# Patient Record
Sex: Female | Born: 1985 | Race: Black or African American | Hispanic: No | Marital: Single | Smoking: Never smoker
Health system: Southern US, Community
[De-identification: ages and names within clinical notes are randomized; demographics above are authoritative.]

## PROBLEM LIST (undated history)

## (undated) ENCOUNTER — Inpatient Hospital Stay (HOSPITAL_COMMUNITY): Payer: Self-pay

## (undated) DIAGNOSIS — D219 Benign neoplasm of connective and other soft tissue, unspecified: Secondary | ICD-10-CM

## (undated) DIAGNOSIS — R87629 Unspecified abnormal cytological findings in specimens from vagina: Secondary | ICD-10-CM

## (undated) HISTORY — PX: WISDOM TOOTH EXTRACTION: SHX21

## (undated) HISTORY — DX: Benign neoplasm of connective and other soft tissue, unspecified: D21.9

## (undated) HISTORY — PX: COLPOSCOPY: SHX161

## (undated) HISTORY — PX: THERAPEUTIC ABORTION: SHX798

---

## 2003-12-05 ENCOUNTER — Emergency Department (HOSPITAL_COMMUNITY): Admission: EM | Admit: 2003-12-05 | Discharge: 2003-12-05 | Payer: Self-pay | Admitting: Emergency Medicine

## 2003-12-05 ENCOUNTER — Inpatient Hospital Stay (HOSPITAL_COMMUNITY): Admission: AD | Admit: 2003-12-05 | Discharge: 2003-12-05 | Payer: Self-pay | Admitting: *Deleted

## 2003-12-05 ENCOUNTER — Encounter (INDEPENDENT_AMBULATORY_CARE_PROVIDER_SITE_OTHER): Payer: Self-pay | Admitting: Specialist

## 2004-10-07 ENCOUNTER — Inpatient Hospital Stay (HOSPITAL_COMMUNITY): Admission: AD | Admit: 2004-10-07 | Discharge: 2004-10-07 | Payer: Self-pay | Admitting: Family Medicine

## 2005-05-18 ENCOUNTER — Emergency Department (HOSPITAL_COMMUNITY): Admission: AD | Admit: 2005-05-18 | Discharge: 2005-05-18 | Payer: Self-pay | Admitting: Emergency Medicine

## 2005-09-27 ENCOUNTER — Emergency Department (HOSPITAL_COMMUNITY): Admission: EM | Admit: 2005-09-27 | Discharge: 2005-09-28 | Payer: Self-pay | Admitting: Emergency Medicine

## 2006-06-03 ENCOUNTER — Inpatient Hospital Stay (HOSPITAL_COMMUNITY): Admission: AD | Admit: 2006-06-03 | Discharge: 2006-06-03 | Payer: Self-pay | Admitting: Gynecology

## 2006-11-16 ENCOUNTER — Inpatient Hospital Stay (HOSPITAL_COMMUNITY): Admission: AD | Admit: 2006-11-16 | Discharge: 2006-11-16 | Payer: Self-pay | Admitting: Obstetrics and Gynecology

## 2006-11-16 ENCOUNTER — Inpatient Hospital Stay (HOSPITAL_COMMUNITY): Admission: AD | Admit: 2006-11-16 | Discharge: 2006-11-19 | Payer: Self-pay | Admitting: Obstetrics and Gynecology

## 2006-12-24 ENCOUNTER — Emergency Department (HOSPITAL_COMMUNITY): Admission: EM | Admit: 2006-12-24 | Discharge: 2006-12-24 | Payer: Self-pay | Admitting: Emergency Medicine

## 2007-06-22 ENCOUNTER — Emergency Department (HOSPITAL_COMMUNITY): Admission: EM | Admit: 2007-06-22 | Discharge: 2007-06-22 | Payer: Self-pay | Admitting: Family Medicine

## 2009-07-26 ENCOUNTER — Emergency Department (HOSPITAL_COMMUNITY): Admission: EM | Admit: 2009-07-26 | Discharge: 2009-07-26 | Payer: Self-pay | Admitting: Emergency Medicine

## 2010-07-10 ENCOUNTER — Emergency Department (HOSPITAL_COMMUNITY)
Admission: EM | Admit: 2010-07-10 | Discharge: 2010-07-11 | Payer: Self-pay | Source: Home / Self Care | Admitting: Emergency Medicine

## 2010-07-11 LAB — WET PREP, GENITAL
Trich, Wet Prep: NONE SEEN
WBC, Wet Prep HPF POC: NONE SEEN
Yeast Wet Prep HPF POC: NONE SEEN

## 2010-07-11 LAB — URINALYSIS, ROUTINE W REFLEX MICROSCOPIC
Bilirubin Urine: NEGATIVE
Ketones, ur: NEGATIVE mg/dL
Leukocytes, UA: NEGATIVE
Nitrite: NEGATIVE
Protein, ur: NEGATIVE mg/dL
Specific Gravity, Urine: 1.03 (ref 1.005–1.030)
Urine Glucose, Fasting: NEGATIVE mg/dL
Urobilinogen, UA: 1 mg/dL (ref 0.0–1.0)
pH: 6 (ref 5.0–8.0)

## 2010-07-11 LAB — URINE MICROSCOPIC-ADD ON

## 2010-07-11 LAB — PREGNANCY, URINE: Preg Test, Ur: NEGATIVE

## 2010-07-12 LAB — GC/CHLAMYDIA PROBE AMP, GENITAL
Chlamydia, DNA Probe: NEGATIVE
GC Probe Amp, Genital: NEGATIVE

## 2010-07-19 ENCOUNTER — Inpatient Hospital Stay (INDEPENDENT_AMBULATORY_CARE_PROVIDER_SITE_OTHER)
Admission: RE | Admit: 2010-07-19 | Discharge: 2010-07-19 | Disposition: A | Discharge status: Home / Self Care | Payer: 59 | Source: Ambulatory Visit | Attending: Family Medicine | Admitting: Family Medicine

## 2010-07-19 DIAGNOSIS — L259 Unspecified contact dermatitis, unspecified cause: Secondary | ICD-10-CM

## 2010-10-29 NOTE — Discharge Summary (Signed)
NAME:  Chelsey Cook, Chelsey Cook             ACCOUNT NO.:  [id number]   MEDICAL RECORD NO.:  [id number]          PATIENT TYPE:  INP   LOCATION:  9119                          FACILITY:  [facility]   PHYSICIAN:  Osborn Coho, M.D.   DATE OF BIRTH:  January 03, 1986   DATE OF ADMISSION:  11/16/2006  DATE OF DISCHARGE:  11/19/2006                               DISCHARGE SUMMARY   ADMISSION DIAGNOSES:  1. Intrauterine pregnancy at term.  2. Early labor.  3. Spontaneous rupture of membranes.   DISCHARGE DIAGNOSES:  1. Intrauterine pregnancy at term.  2. Meconium-stained fluid.  3. Fetal tachycardia.  4. Maternal fever.   PROCEDURES:  1. Vacuum-assisted vaginal delivery.  2. Epidural anesthesia.   HOSPITAL COURSE:  [name] is a 25 year old gravida 2, para 0-0-1-0 at  39-1/7 weeks who presented in early labor on the afternoon of November 16, 2006.  The pregnancy has been remarkable for:  1. First trimester BV.  2. Group B strep negative.   On admission, the patient was 3 cm, 70%, vertex at minus 1 station.  She  had spontaneous rupture of membranes on exam with thin meconium and  blood-tinged fluid noted.  An epidural was placed for comfort.  Temperature at that time was 99.1.  Cervix was 6 to 7 cm.  Pitocin was  begun secondary to a plateauing of labor at that stage.  The baseline at  that time was 150s with early decelerations.  IUPC was placed.  The  patient then progressed to fully dilated at 1:55 a.m. and was complete  for 30-40 minutes and pushing with good effort.  Fetal vertex was at a  +4 station.  The fetal heart rate baseline began to increase to a  maximum of 190.  The patient had a temperature of 101.4 at that point  and had been given Tylenol.  She continued with pushing effort; however,  the decision was made in light of maternal fever and fetal tachycardia  to proceed with vacuum-assisted vaginal delivery by Dr. Pennie Rushing.  This  was accomplished without difficulty.  Findings were a  viable female by  the name of Chelsey Cook, weight 6 pounds 12 ounces, Apgars were 8 and 9.  Infant was taken to the full-term nursery.  Mother was taken to recovery  in good condition.  By postpartum day 1, the patient was doing well.  She had a temperature max of 100.9 at 4 a.m. on November 17, 2006.  She had  been afebrile since that time.  Hemoglobin was 9.3 on day 1.  Hematocrit  was 27.2, white blood cell count was 18.5 and platelet count was 208.  She was having no tenderness in her abdomen.  Her fundus was firm.  Her  lochia was moderate.  Baby was bottle feeding.  The rest of the hospital  stay, the patient did well.  She had no complications.  She was using  pain medication with good relief.  She had had no lacerations.  By  postpartum day #2, she had a second-degree laceration and did not  require suture.  By postpartum day #2, the  patient was doing well.  She  was up ad lib.  She was tolerating a regular diet.  She was deemed to  have received full benefit of her hospital stay and was discharged home.   DISCHARGE INSTRUCTIONS:  Per [facility] handout.   DISCHARGE MEDICATIONS:  1. Motrin 600 mg p.o. q.6h. p.r.n. pain.  2. Percocet 5/325 one to two p.o. every 3 to 4 hours p.r.n. pain.   Discharge follow-up will occur in 6 weeks at [facility].      Chelsey Cook, C.N.M.      Osborn Coho, M.D.  Electronically Signed    VLL/MEDQ  D:  11/19/2006  T:  11/19/2006  Job:  [id number]

## 2010-10-29 NOTE — Op Note (Signed)
NAME:  [place], Tirzah             ACCOUNT NO.:  [id number]   MEDICAL RECORD NO.:  [id number]          PATIENT TYPE:  INP   LOCATION:  9119                          [facility]ACILITY:  [facility]   PHYSICIAN:  Hal Morales, M.D.DATE OF BIRTH:  January 30, 1986   DATE OF PROCEDURE:  11/17/2006  DATE OF DISCHARGE:                               OPERATIVE REPORT   PREOPERATIVE DIAGNOSES:  1. Intrauterine pregnancy at term.  2. Meconium-stained amniotic fluid.  3. Maternal fever.  4. Fetal tachycardia.   POSTOPERATIVE DIAGNOSES:  1. Intrauterine pregnancy at term.  2. Meconium-stained amniotic fluid.  3. Maternal fever.  4. Fetal tachycardia.   OPERATION:  Vacuum-assisted vaginal delivery.   ANESTHESIA:  Epidural.   ESTIMATED BLOOD LOSS:  Less than 500 mL.   COMPLICATIONS:  None.   FINDINGS:  The patient was delivered of a female infant whose name is  Jurnee, weighing 6 pounds 12 ounces with Apgars of 8 and 9 at one and  five minutes, respectively.  The patient had a second-degree vaginal  laceration at the introitus which was not bleeding and did not require  suture.   PREOPERATIVE ASSESSMENT:  The patient had been complete for  approximately 30-40 minutes and had been pushing with good effort.  The  fetal vertex was at +4 station with approximately 4 cm of dilatation of  the perineum with pushing.  The fetal heart rate which had originally  been in the 150s began to increase the baseline to a maximum of 190s.  The patient had been noted to have a temperature of 101.4 and had been  given Tylenol.  She continued pushing but without much progress beyond  the above-noted point.  At the time that fetal heart reached 190, a  discussion was held concerning shortening the second stage, and the  risks and benefits of vacuum-assisted vaginal delivery were reviewed.  The patient consented to vacuum-assisted delivery.   PROCEDURE:  The bladder had been emptied with a continuous Foley  catheter  until the patient had started pushing.  It had been recently  removed.  The perineum had already been prepped and draped.  A kiwi  vacuum extractor was then placed into the vagina over the fetal vertex,  and with a single contraction, the vertex delivered over the intact  perineum with the aid of the kiwi vacuum.  The vacuum was removed, and  the remainder of the infant was delivered with a combination of gentle  traction and maternal expulsive efforts.  The infant was delivered from  the occiput anterior position.  The nares and pharynx were suctioned and  the cord clamped and cut.  The infant was handed off to the awaiting  assistants in the infant warmer.  The appropriate cord blood was drawn,  and placenta  spontaneously separated from the uterus and was removed from the vagina  with a combination of gentle traction and maternal expulsive efforts.  The perineum was cleansed and inspected.  Ice packs were placed on the  perineum for patient comfort.  The infant went to the full-term nursery.      Erie Noe P  Pennie Rushing, M.D.  Electronically Signed     VPH/MEDQ  D:  11/17/2006  T:  11/17/2006  Job:  [id number]

## 2010-10-29 NOTE — H&P (Signed)
NAME:  Chelsey Cook, Chelsey Cook             ACCOUNT NO.:  [id number]   MEDICAL RECORD NO.:  [id number]          PATIENT TYPE:  INP   LOCATION:  9163                          [facility]ACILITY:  [facility]   PHYSICIAN:  Hal Morales, M.D.DATE OF BIRTH:  [birth year]   DATE OF ADMISSION:  11/16/2006  DATE OF DISCHARGE:                              HISTORY & PHYSICAL   The patient is a 25 year old gravida 2, para 0-0-1-0 who presents at 73-  1/7 weeks, EDD November 22, 2006.  She presents with contractions since 3:00  a.m. becoming stronger and more regular throughout the day. She was  checked in maternity admissions at 5:00 a.m. and found to be 1 cm  dilated.  She was checked later in a day at the office of [facility] and found  to be 2 cm dilated.  She is now noted to be 3 cm dilated with  spontaneous rupture of membranes on exam for thin meconium blood tinged  fluid.  She reports positive fetal movement.  Denies any headache,  visual changes or epigastric pain.  Her pregnancy has been followed by  the MD service at [facility] and is remarkable for:  1. First trimester BV.  2. Group B strep negative.   This patient entered prenatal care at the office of [facility] on May 20, 2006 at approximately 12 weeks' gestation, [facility] determined by the use of  the first trimester ultrasound and confirmed with follow-up.  Her  pregnancy has been essentially unremarkable.  She has been size equal to  dates throughout, normotensive with no proteinuria.  The patient is  noted to have positive chlamydia in the third trimester.  Then was  treated on Oct 26, 2006 with Zithromax 1 gram p.o.  Prenatal lab work on  May 20, 2006 hemoglobin and hematocrit 12.9 at 36.3, platelets  297,000.  Blood type and Rh O+, antibody screen negative, sickle cell  trait negative, VDRL nonreactive, rubella immune, hepatitis B surface  antigen negative, HIV nonreactive, CF testing negative.  Quad screen  within normal limits.  36 weeks culture of the vaginal  tract is negative  for group B strep, positive for chlamydia.  She was treated as stated  above with Zithromax 1 gram on Oct 26, 2006.   OB HISTORY:  In 2005 the patient had a first trimester SAB. This is her  second and current pregnancy.   SURGICAL HISTORY:  Oral surgery for wisdom teeth at age 25.  The patient  denies the use of tobacco, alcohol or illicit drugs.   FAMILY HISTORY:  Maternal grandmother with a history of chronic  hypertension. The patient's mother with a history of thyroid disease.  Brother with migraines.  Paternal grandmother and paternal grandfather  deceased with cancer of unknown type.   GENETIC HISTORY:  The patient has a cousin with cerebral palsy.   SOCIAL HISTORY:  [name] is a single African American female.  She is  a Consulting civil engineer at [employer]. The father of the baby Casimiro Needle, works in [employer]ice [employer].  He is supportive.  They are [facility] in their faith.  The  patient has  no known drug allergies.   REVIEW OF SYMPTOMS:  Review of systems is as described above.  The  patient is 25 years old gravida 2, para 0-0-1-0 who presents at 39-1/7  weeks in early labor.  She is noted to be 3 cm dilated with spontaneous  rupture of membranes on exam for thin meconium blood tinged fluid.   PHYSICAL EXAM:  VITAL SIGNS: Stable, afebrile.  HEENT: Is unremarkable.  HEART:  Regular rate and rhythm.  LUNGS:  Clear.  ABDOMEN:  Soft and nontender.  It is gravid in its contour.  Uterine  fundus is noted to extend 39 cm above the level of the pubic symphysis.  Leopold's maneuvers finds the infant to be a longitudinal lie, cephalic  presentation and the estimated fetal weight is 7-1/2 pounds.  Baseline  of fetal heart rate monitor is 128 with average long-term variability,  reactivity is present with no periodic changes.  The patient is  contracting every 5 minutes.  PELVIC:  Digital exam of the cervix finds it to be 3 cm dilated, 70%  effaced with a cephalic presenting part at  -1 station.  Spontaneous  rupture of membranes occurred on exam for meconium-stained blood tinged  fluid.  EXTREMITIES:  Show no pathologic edema.  DTRs are 1+ with no clonus,  negative Denna Haggard' sign is noted bilaterally.   ASSESSMENT:  Intrauterine pregnancy at term, early labor.  Spontaneous  rupture of membranes.   PLAN:  Admit per Dr. Dierdre Forth, routine MD orders.  The patient  may have epidural p.r.n. for pain relief.      Rica Koyanagi, C.N.M.      Hal Morales, M.D.  Electronically Signed    SDM/MEDQ  D:  11/16/2006  T:  11/16/2006  Job:  [id number]

## 2011-01-17 ENCOUNTER — Inpatient Hospital Stay (INDEPENDENT_AMBULATORY_CARE_PROVIDER_SITE_OTHER)
Admission: RE | Admit: 2011-01-17 | Discharge: 2011-01-17 | Disposition: A | Discharge status: Home / Self Care | Payer: 59 | Source: Ambulatory Visit | Attending: Family Medicine | Admitting: Family Medicine

## 2011-01-17 DIAGNOSIS — K5289 Other specified noninfective gastroenteritis and colitis: Secondary | ICD-10-CM

## 2011-03-05 LAB — URINE CULTURE: Colony Count: 25000

## 2011-03-05 LAB — POCT URINALYSIS DIP (DEVICE)
Glucose, UA: NEGATIVE
Ketones, ur: 15 — AB
Nitrite: NEGATIVE
Operator id: 235561
Protein, ur: 30 — AB
Specific Gravity, Urine: 1.025
Urobilinogen, UA: 2 — ABNORMAL HIGH
pH: 6

## 2011-03-05 LAB — POCT PREGNANCY, URINE
Operator id: 235561
Preg Test, Ur: NEGATIVE

## 2011-04-03 LAB — CBC
HCT: 27.2 — ABNORMAL LOW
HCT: 35.8 — ABNORMAL LOW
Hemoglobin: 12.1
Hemoglobin: 9.3 — ABNORMAL LOW
MCHC: 33.8
MCHC: 34.3
MCV: 90.4
MCV: 91.4
Platelets: 208
Platelets: 245
RBC: 2.98 — ABNORMAL LOW
RBC: 3.96
RDW: 14
RDW: 14.3 — ABNORMAL HIGH
WBC: 11 — ABNORMAL HIGH
WBC: 18.5 — ABNORMAL HIGH

## 2011-04-03 LAB — GC/CHLAMYDIA PROBE AMP, GENITAL
Chlamydia, DNA Probe: NEGATIVE
GC Probe Amp, Genital: NEGATIVE

## 2011-04-03 LAB — RPR TITER: RPR Titer: 1:2 {titer} — AB

## 2011-04-03 LAB — RPR: RPR Ser Ql: REACTIVE — AB

## 2011-04-11 LAB — TPPA: Treponema Confirm: NONREACTIVE

## 2011-06-24 ENCOUNTER — Emergency Department (INDEPENDENT_AMBULATORY_CARE_PROVIDER_SITE_OTHER)
Admission: EM | Admit: 2011-06-24 | Discharge: 2011-06-24 | Disposition: A | Discharge status: Home / Self Care | Payer: BC Managed Care – PPO | Source: Home / Self Care | Attending: Emergency Medicine | Admitting: Emergency Medicine

## 2011-06-24 ENCOUNTER — Encounter: Payer: Self-pay | Admitting: *Deleted

## 2011-06-24 DIAGNOSIS — J029 Acute pharyngitis, unspecified: Secondary | ICD-10-CM

## 2011-06-24 LAB — POCT RAPID STREP A: Streptococcus, Group A Screen (Direct): NEGATIVE

## 2011-06-24 MED ORDER — DICLOFENAC SODIUM 75 MG PO TBEC: 75.0000 mg | DELAYED_RELEASE_TABLET | Freq: Two times a day (BID) | ORAL | Status: AC

## 2011-06-24 NOTE — ED Notes (Signed)
Pt  Reports  Symptoms  Of  sorethroat        And  painfull  Swallowing  For  sev  Days  Symptoms  Not  releived  By otc  meds   She  Is  Sitting  Upright  On  Exam table  And  Appears  In no  Distress        Speaking in  Complete  sentances

## 2011-06-24 NOTE — ED Provider Notes (Signed)
Chief Complaint  Patient presents with  . Sore Throat    History of Present Illness:  Chelsey Cook is a 26 year old female who has had a two-day history of sore throat, pain on swallowing, and nasal congestion. She has gotten the influenza vaccine. She denies any rhinorrhea or sneezing. No adenopathy, headache, coughing, shortness of breath, abdominal pain, nausea, or vomiting. She has not been exposed to strep.  Review of Systems:  Other than noted above, the patient denies any of the following symptoms. Systemic:  No fever, chills, sweats, fatigue, myalgias, headache, or anorexia. Eye:  No redness, pain or drainage. ENT:  No earache, nasal congestion, rhinorrhea, sinus pressure, or sore throat. Lungs:  No cough, sputum production, wheezing, shortness of breath. Or chest pain. GI:  No nausea, vomiting, abdominal pain or diarrhea. Skin:  No rash or itching.  PMFSH:  Past medical history, family history, social history, meds, and allergies were reviewed.  Physical Exam:   Vital signs:  BP 119/77  Pulse 73  Temp(Src) 98.8 F (37.1 C) (Oral)  Resp 16  SpO2 100% General:  Alert, in no distress. Eye:  No conjunctival injection or drainage. ENT:  TMs and canals were normal, without erythema or inflammation.  Nasal mucosa was clear and uncongested, without drainage.  Mucous membranes were moist.  Pharynx was erythematous, without exudate or drainage.  There were no oral ulcerations or lesions. Neck:  Supple, no adenopathy, tenderness or mass. Lungs:  No respiratory distress.  Lungs were clear to auscultation, without wheezes, rales or rhonchi.  Breath sounds were clear and equal bilaterally. Heart:  Regular rhythm, without gallops, murmers or rubs. Skin:  Clear, warm, and dry, without rash or lesions.  Labs:   Results for orders placed during the hospital encounter of 06/24/11  POCT RAPID STREP A ([facility] ONLY)      Component Value Range   Streptococcus, Group A Screen (Direct) NEGATIVE   NEGATIVE      Radiology:  No results found.  Medications given in UCC:  None  Assessment:   Diagnoses that have been ruled out:  Diagnoses that are still under consideration:  Final diagnoses:  Viral pharyngitis     Plan:   1.  The following meds were prescribed:   New Prescriptions   DICLOFENAC (VOLTAREN) 75 MG EC TABLET    Take 1 tablet (75 mg total) by mouth 2 (two) times daily.   2.  The patient was instructed in symptomatic care and handouts were given. 3.  The patient was told to return if becoming worse in any way, if no better in 3 or 4 days, and given some red flag symptoms that would indicate earlier return.   Roque Lias, MD 06/24/11 [phone]

## 2012-01-07 ENCOUNTER — Encounter: Payer: Self-pay | Admitting: Cardiology

## 2012-11-04 ENCOUNTER — Encounter (HOSPITAL_COMMUNITY): Payer: Self-pay | Admitting: Emergency Medicine

## 2012-11-04 ENCOUNTER — Emergency Department (HOSPITAL_COMMUNITY)
Admission: EM | Admit: 2012-11-04 | Discharge: 2012-11-04 | Disposition: A | Discharge status: Home / Self Care | Payer: BC Managed Care – PPO | Attending: Emergency Medicine | Admitting: Emergency Medicine

## 2012-11-04 DIAGNOSIS — R197 Diarrhea, unspecified: Secondary | ICD-10-CM | POA: Insufficient documentation

## 2012-11-04 DIAGNOSIS — E860 Dehydration: Secondary | ICD-10-CM | POA: Insufficient documentation

## 2012-11-04 DIAGNOSIS — R000 Tachycardia, unspecified: Secondary | ICD-10-CM | POA: Insufficient documentation

## 2012-11-04 DIAGNOSIS — Z8711 Personal history of peptic ulcer disease: Secondary | ICD-10-CM | POA: Insufficient documentation

## 2012-11-04 DIAGNOSIS — R109 Unspecified abdominal pain: Secondary | ICD-10-CM | POA: Insufficient documentation

## 2012-11-04 LAB — URINALYSIS, ROUTINE W REFLEX MICROSCOPIC
Bilirubin Urine: NEGATIVE
Glucose, UA: NEGATIVE mg/dL
Ketones, ur: 15 mg/dL — AB
Nitrite: NEGATIVE
Protein, ur: NEGATIVE mg/dL
Specific Gravity, Urine: 1.025 (ref 1.005–1.030)
Urobilinogen, UA: 0.2 mg/dL (ref 0.0–1.0)
pH: 6 (ref 5.0–8.0)

## 2012-11-04 LAB — CBC WITH DIFFERENTIAL/PLATELET
Basophils Absolute: 0 10*3/uL (ref 0.0–0.1)
Basophils Relative: 0 % (ref 0–1)
Eosinophils Absolute: 0.3 10*3/uL (ref 0.0–0.7)
Eosinophils Relative: 5 % (ref 0–5)
HCT: 39.2 % (ref 36.0–46.0)
Hemoglobin: 14.1 g/dL (ref 12.0–15.0)
Lymphocytes Relative: 26 % (ref 12–46)
Lymphs Abs: 1.5 10*3/uL (ref 0.7–4.0)
MCH: 31.5 pg (ref 26.0–34.0)
MCHC: 36 g/dL (ref 30.0–36.0)
MCV: 87.7 fL (ref 78.0–100.0)
Monocytes Absolute: 0.7 10*3/uL (ref 0.1–1.0)
Monocytes Relative: 11 % (ref 3–12)
Neutro Abs: 3.4 10*3/uL (ref 1.7–7.7)
Neutrophils Relative %: 58 % (ref 43–77)
Platelets: 325 10*3/uL (ref 150–400)
RBC: 4.47 MIL/uL (ref 3.87–5.11)
RDW: 12.5 % (ref 11.5–15.5)
WBC: 5.9 10*3/uL (ref 4.0–10.5)

## 2012-11-04 LAB — BASIC METABOLIC PANEL
BUN: 8 mg/dL (ref 6–23)
CO2: 19 mEq/L (ref 19–32)
Calcium: 9 mg/dL (ref 8.4–10.5)
Chloride: 104 mEq/L (ref 96–112)
Creatinine, Ser: 0.64 mg/dL (ref 0.50–1.10)
GFR calc Af Amer: >90 mL/min (ref >90)
GFR calc non Af Amer: >90 mL/min (ref >90)
Glucose, Bld: 93 mg/dL (ref 70–99)
Potassium: 3.2 mEq/L — ABNORMAL LOW (ref 3.5–5.1)
Sodium: 138 mEq/L (ref 135–145)

## 2012-11-04 LAB — URINE MICROSCOPIC-ADD ON

## 2012-11-04 LAB — PREGNANCY, URINE: Preg Test, Ur: NEGATIVE

## 2012-11-04 MED ORDER — ONDANSETRON 4 MG PO TBDP: 4.0000 mg | ORAL_TABLET | Freq: Once | ORAL | Status: DC

## 2012-11-04 MED ORDER — SODIUM CHLORIDE 0.9 % IV BOLUS (SEPSIS)
1000.0000 mL | Freq: Once | INTRAVENOUS | Status: AC
  Administered 2012-11-04: 1000 mL via INTRAVENOUS

## 2012-11-04 MED ORDER — POTASSIUM CHLORIDE CRYS ER 20 MEQ PO TBCR
40.0000 meq | EXTENDED_RELEASE_TABLET | Freq: Once | ORAL | Status: AC
  Administered 2012-11-04: 40 meq via ORAL
  Filled 2012-11-04: qty 2

## 2012-11-04 NOTE — ED Notes (Signed)
Patient states currently not nausea and does not want zofran. PA notified.

## 2012-11-04 NOTE — ED Notes (Signed)
Onset 2 days ago general abdominal pain achy with diarrhea. Denies nausea and emesis.

## 2012-11-04 NOTE — ED Provider Notes (Signed)
Medical screening examination/treatment/procedure(s) were performed by non-physician practitioner and as supervising physician I was immediately available for consultation/collaboration.   Rolan Bucco, MD 11/04/12 1517

## 2012-11-04 NOTE — ED Provider Notes (Signed)
History     CSN: [id number]  Arrival date & time 11/04/12  4696   First MD Initiated Contact with Patient 11/04/12 1008      Chief Complaint  Patient presents with  . Abdominal Pain  . Diarrhea    (Consider location/radiation/quality/duration/timing/severity/associated sxs/prior treatment) HPI  Patient presents to the ER by private care for painless diarrhea since Tuesday. She has been unable to keep any food or liquid down. She has tried eating hot dogs, cheese and cheese cake with diarrhea. For liquid she has been attempting to drink milk. She took Lomotil one time but drank milk right afterwards and had an episode of diarrhea. She denies any cramping or abdominal pains. No vomiting or nausea, no sick contacts. Denies eating fast food or left overs. No bloating or hx of recent seizures, no vaginal dc or dysuria. Came in because of concerns of being dehydrated.  Patient is tachycardic at 102. Rest of vital signs are stable, healthy at baseline.   History reviewed. No pertinent past medical history.  History reviewed. No pertinent past surgical history.  No family history on file.  History  Substance Use Topics  . Smoking status: Never Smoker   . Smokeless tobacco: Not on file  . Alcohol Use: Yes    OB History   Grav Para Term Preterm Abortions TAB SAB Ect Mult Living                  Review of Systems  Allergies  Review of patient's allergies indicates no known allergies.  Home Medications   Current Outpatient Rx  Name  Route  Sig  Dispense  Refill  . acetaminophen (TYLENOL) 325 MG tablet   Oral   Take 650 mg by mouth daily as needed for pain.         Marland Kitchen bismuth subsalicylate (PEPTO BISMOL) 262 MG chewable tablet   Oral   Chew 524 mg by mouth as needed for indigestion.         . Loperamide HCl (IMODIUM PO)   Oral   Take 2 tablets by mouth daily as needed (upset stomach).           BP 126/84  Pulse 97  Temp(Src) 98.4 F (36.9 C) (Oral)  Resp 16   Ht 5\' 3"  (1.6 m)  Wt 133 lb (60.328 kg)  BMI 23.57 kg/m2  SpO2 100%  Physical Exam  Nursing note and vitals reviewed. Constitutional: She appears well-developed and well-nourished. No distress.  HENT:  Head: Normocephalic and atraumatic.  Eyes: Pupils are equal, round, and reactive to light.  Neck: Normal range of motion. Neck supple.  Cardiovascular: Normal rate and regular rhythm.   Pulmonary/Chest: Effort normal.  Abdominal: Soft.  Neurological: She is alert.  Skin: Skin is warm and dry.    ED Course  Procedures (including critical care time)  Labs Reviewed  BASIC METABOLIC PANEL - Abnormal; Notable for the following:    Potassium 3.2 (*)    All other components within normal limits  URINALYSIS, ROUTINE W REFLEX MICROSCOPIC - Abnormal; Notable for the following:    APPearance HAZY (*)    Hgb urine dipstick SMALL (*)    Ketones, ur 15 (*)    Leukocytes, UA SMALL (*)    All other components within normal limits  URINE MICROSCOPIC-ADD ON - Abnormal; Notable for the following:    Squamous Epithelial / LPF MANY (*)    Bacteria, UA MANY (*)    All other components within normal  limits  PREGNANCY, URINE  CBC WITH DIFFERENTIAL   No results found.   1. Diarrhea       MDM  No pain or cramping. Patient given 2L normal saline for rehydration and tachycardia resolved. No lab signs of dehydration. Potassium a little low and placed in ED. Discussed BRAT diet/ hydration for diarrhea. Follow-up with PCP.  Pt has been advised of the symptoms that warrant their return to the ED. Patient has voiced understanding and has agreed to follow-up with the PCP or specialist.         Dorthula Matas, PA-C 11/04/12 1238

## 2013-03-13 ENCOUNTER — Other Ambulatory Visit (HOSPITAL_COMMUNITY)
Admission: RE | Admit: 2013-03-13 | Discharge: 2013-03-13 | Disposition: A | Discharge status: Home / Self Care | Payer: BC Managed Care – PPO | Source: Ambulatory Visit | Attending: Family Medicine | Admitting: Family Medicine

## 2013-03-13 ENCOUNTER — Emergency Department (INDEPENDENT_AMBULATORY_CARE_PROVIDER_SITE_OTHER)
Admission: EM | Admit: 2013-03-13 | Discharge: 2013-03-13 | Disposition: A | Discharge status: Home / Self Care | Payer: BC Managed Care – PPO | Source: Home / Self Care | Attending: Family Medicine | Admitting: Family Medicine

## 2013-03-13 ENCOUNTER — Encounter (HOSPITAL_COMMUNITY): Payer: Self-pay | Admitting: *Deleted

## 2013-03-13 DIAGNOSIS — N939 Abnormal uterine and vaginal bleeding, unspecified: Secondary | ICD-10-CM

## 2013-03-13 DIAGNOSIS — N898 Other specified noninflammatory disorders of vagina: Secondary | ICD-10-CM

## 2013-03-13 DIAGNOSIS — Z113 Encounter for screening for infections with a predominantly sexual mode of transmission: Secondary | ICD-10-CM | POA: Insufficient documentation

## 2013-03-13 DIAGNOSIS — N760 Acute vaginitis: Secondary | ICD-10-CM | POA: Insufficient documentation

## 2013-03-13 LAB — HCG, QUANTITATIVE, PREGNANCY: hCG, Beta Chain, Quant, S: <1 m[IU]/mL (ref <5)

## 2013-03-13 LAB — BASIC METABOLIC PANEL
BUN: 10 mg/dL (ref 6–23)
CO2: 23 mEq/L (ref 19–32)
Calcium: 9.2 mg/dL (ref 8.4–10.5)
Chloride: 103 mEq/L (ref 96–112)
Creatinine, Ser: 0.65 mg/dL (ref 0.50–1.10)
GFR calc Af Amer: >90 mL/min (ref >90)
GFR calc non Af Amer: >90 mL/min (ref >90)
Glucose, Bld: 91 mg/dL (ref 70–99)
Potassium: 3.5 mEq/L (ref 3.5–5.1)
Sodium: 139 mEq/L (ref 135–145)

## 2013-03-13 LAB — CBC WITH DIFFERENTIAL/PLATELET
Basophils Absolute: 0 10*3/uL (ref 0.0–0.1)
Basophils Relative: 0 % (ref 0–1)
Eosinophils Absolute: 0 10*3/uL (ref 0.0–0.7)
Eosinophils Relative: 1 % (ref 0–5)
HCT: 40 % (ref 36.0–46.0)
Hemoglobin: 14.2 g/dL (ref 12.0–15.0)
Lymphocytes Relative: 35 % (ref 12–46)
Lymphs Abs: 1.5 10*3/uL (ref 0.7–4.0)
MCH: 32.2 pg (ref 26.0–34.0)
MCHC: 35.5 g/dL (ref 30.0–36.0)
MCV: 90.7 fL (ref 78.0–100.0)
Monocytes Absolute: 0.3 10*3/uL (ref 0.1–1.0)
Monocytes Relative: 7 % (ref 3–12)
Neutro Abs: 2.4 10*3/uL (ref 1.7–7.7)
Neutrophils Relative %: 57 % (ref 43–77)
Platelets: 269 10*3/uL (ref 150–400)
RBC: 4.41 MIL/uL (ref 3.87–5.11)
RDW: 12.2 % (ref 11.5–15.5)
WBC: 4.2 10*3/uL (ref 4.0–10.5)

## 2013-03-13 LAB — POCT URINALYSIS DIP (DEVICE)
Glucose, UA: NEGATIVE mg/dL
Ketones, ur: NEGATIVE mg/dL
Leukocytes, UA: NEGATIVE
Nitrite: NEGATIVE
Protein, ur: 100 mg/dL — AB
Specific Gravity, Urine: >=1.03 (ref 1.005–1.030)
Urobilinogen, UA: 1 mg/dL (ref 0.0–1.0)
pH: 7 (ref 5.0–8.0)

## 2013-03-13 LAB — POCT PREGNANCY, URINE: Preg Test, Ur: NEGATIVE

## 2013-03-13 NOTE — ED Provider Notes (Signed)
CSN: [id number]     Arrival date & time 03/13/13  1019 History   First MD Initiated Contact with Patient 03/13/13 1132     Chief Complaint  Patient presents with  . Vaginal Bleeding   (Consider location/radiation/quality/duration/timing/severity/associated sxs/prior Treatment) HPI Comments: 27 year old female presents complaining of abnormal vaginal bleeding. Her last normal period started on and it was normal. Yesterday she started bleeding again and is having abdominal cramping. She does not normally have abdominal cramping with her period, but the blood reminds her of a lighter than normal period.  She has never had abnormal bleeding like this before. She denies abdominal pain, just cramping. She denies fever, chills, NVD, chest pain, shortness of breath, rash. She is sexually active and does not use any form of birth control. Denies risk factors for STDs.  Patient is a 27 y.o. female presenting with vaginal bleeding.  Vaginal Bleeding Associated symptoms: abdominal pain (cramping)   Associated symptoms: no dizziness, no dysuria, no fever and no nausea     History reviewed. No pertinent past medical history. History reviewed. No pertinent past surgical history. No family history on file. History  Substance Use Topics  . Smoking status: Never Smoker   . Smokeless tobacco: Not on file  . Alcohol Use: Yes     Comment: occasionally   OB History   Grav Para Term Preterm Abortions TAB SAB Ect Mult Living                 Review of Systems  Constitutional: Negative for fever and chills.  Eyes: Negative for visual disturbance.  Respiratory: Negative for cough and shortness of breath.   Cardiovascular: Negative for chest pain, palpitations and leg swelling.  Gastrointestinal: Positive for abdominal pain (cramping). Negative for nausea and vomiting.  Endocrine: Negative for polydipsia and polyuria.  Genitourinary: Positive for vaginal bleeding. Negative for dysuria, urgency and  frequency.  Musculoskeletal: Negative for myalgias and arthralgias.  Skin: Negative for rash.  Neurological: Negative for dizziness, weakness and light-headedness.    Allergies  Review of patient's allergies indicates no known allergies.  Home Medications   Current Outpatient Rx  Name  Route  Sig  Dispense  Refill  . acetaminophen (TYLENOL) 325 MG tablet   Oral   Take 650 mg by mouth daily as needed for pain.         Marland Kitchen bismuth subsalicylate (PEPTO BISMOL) 262 MG chewable tablet   Oral   Chew 524 mg by mouth as needed for indigestion.         . Loperamide HCl (IMODIUM PO)   Oral   Take 2 tablets by mouth daily as needed (upset stomach).          BP 134/89  Pulse 90  Temp(Src) 98.9 F (37.2 C) (Oral)  Resp 19  SpO2 99%  LMP 02/23/2013 Physical Exam  Nursing note and vitals reviewed. Constitutional: She is oriented to person, place, and time. Vital signs are normal. She appears well-developed and well-nourished. No distress.  HENT:  Head: Normocephalic and atraumatic.  Cardiovascular: Normal rate, regular rhythm and normal heart sounds.   Pulmonary/Chest: Effort normal and breath sounds normal. No respiratory distress.  Abdominal: Soft. She exhibits no mass. There is no tenderness. There is no rebound and no guarding.  Genitourinary: There is bleeding (Large amount of blood in the vaginal vault) around the vagina.  Neurological: She is alert and oriented to person, place, and time. She has normal strength. Coordination normal.  Skin: Skin is  warm and dry. No rash noted. She is not diaphoretic.  Psychiatric: She has a normal mood and affect. Judgment normal.    ED Course  Procedures (including critical care time) Labs Review Labs Reviewed  POCT URINALYSIS DIP (DEVICE) - Abnormal; Notable for the following:    Bilirubin Urine SMALL (*)    Hgb urine dipstick LARGE (*)    Protein, ur 100 (*)    All other components within normal limits  HCG, QUANTITATIVE,  PREGNANCY  CBC WITH DIFFERENTIAL  BASIC METABOLIC PANEL  POCT PREGNANCY, URINE  CERVICOVAGINAL ANCILLARY ONLY   Imaging Review No results found.  MDM   1. Vaginal bleeding    Sending labs for vaginal infections, CBC, BMP. Sending hCG quantitative as well. This may just be normal period and It has only been going on for 2 days. Will use watchful waiting at this time awaiting for labs to come back. Followup with GYN in one week.   Labs all came back normal. Awaiting results of swabs.    Graylon Good, PA-C 03/13/13 2044

## 2013-03-13 NOTE — ED Notes (Signed)
Pt reports last normal menses was 02/23/2013.  She started bleeding again yesterday, less than the normal flow.   She has only used one peri pad today.  She has never had irregular bleeding and wanted to be checked

## 2013-03-15 NOTE — ED Provider Notes (Signed)
Medical screening examination/treatment/procedure(s) were performed by a resident physician or non-physician practitioner and as the supervising physician I was immediately available for consultation/collaboration.  Lylian Sanagustin, MD    Gio Janoski S Bettye Sitton, MD 03/15/13 0742 

## 2013-03-17 ENCOUNTER — Telehealth (HOSPITAL_COMMUNITY): Payer: Self-pay | Admitting: Family Medicine

## 2013-03-17 MED ORDER — METRONIDAZOLE 500 MG PO TABS: 500.0000 mg | ORAL_TABLET | Freq: Two times a day (BID) | ORAL | Status: DC

## 2013-03-17 NOTE — Telephone Encounter (Signed)
Message copied by Rodolph Bong on Thu Mar 17, 2013  7:56 PM ------      Message from: Vassie Moselle      Created: Thu Mar 17, 2013  4:03 PM      Regarding: lab       Gardnerella pos. Rest of labs neg.  Pt. of Zach's- you cosigned.  Do you want to treat this?      Vassie Moselle      03/17/2013       ------

## 2013-03-17 NOTE — ED Notes (Signed)
[place] a positive Called in [place] RN to call patient  Rodolph Bong, MD 03/17/13 [phone]

## 2013-03-18 ENCOUNTER — Telehealth (HOSPITAL_COMMUNITY): Payer: Self-pay | Admitting: *Deleted

## 2013-03-18 NOTE — ED Notes (Addendum)
GC/Chlamydia neg., Affirm: Candida and Trich neg., Gardnerella pos., BHCG <1.  Dr. Denyse Amass e-prescribed Flagyl to [facility] at [facility].  I called pt. and left a message to call. Chelsey Cook 03/18/2013 Pt. called back.  Pt. verified x 2 and given results.  Pt. told she needs Flagyl for bacterial vaginosis.   Pt. instructed to no alcohol while taking this medication.  Pt. told where to pick up Rx. Pt. voiced understanding.

## 2014-05-22 ENCOUNTER — Emergency Department (HOSPITAL_COMMUNITY): Payer: BC Managed Care – PPO

## 2014-05-22 ENCOUNTER — Emergency Department (HOSPITAL_COMMUNITY)
Admission: EM | Admit: 2014-05-22 | Discharge: 2014-05-22 | Disposition: A | Discharge status: Home / Self Care | Payer: Self-pay | Attending: Emergency Medicine | Admitting: Emergency Medicine

## 2014-05-22 ENCOUNTER — Encounter (HOSPITAL_COMMUNITY): Payer: Self-pay | Admitting: Physical Medicine and Rehabilitation

## 2014-05-22 DIAGNOSIS — O4690 Antepartum hemorrhage, unspecified, unspecified trimester: Secondary | ICD-10-CM

## 2014-05-22 DIAGNOSIS — O9989 Other specified diseases and conditions complicating pregnancy, childbirth and the puerperium: Secondary | ICD-10-CM | POA: Insufficient documentation

## 2014-05-22 DIAGNOSIS — Z3202 Encounter for pregnancy test, result negative: Secondary | ICD-10-CM | POA: Insufficient documentation

## 2014-05-22 DIAGNOSIS — Z3A01 Less than 8 weeks gestation of pregnancy: Secondary | ICD-10-CM | POA: Insufficient documentation

## 2014-05-22 DIAGNOSIS — R1032 Left lower quadrant pain: Secondary | ICD-10-CM

## 2014-05-22 DIAGNOSIS — O039 Complete or unspecified spontaneous abortion without complication: Secondary | ICD-10-CM

## 2014-05-22 DIAGNOSIS — D649 Anemia, unspecified: Secondary | ICD-10-CM

## 2014-05-22 DIAGNOSIS — O99011 Anemia complicating pregnancy, first trimester: Secondary | ICD-10-CM | POA: Insufficient documentation

## 2014-05-22 DIAGNOSIS — R000 Tachycardia, unspecified: Secondary | ICD-10-CM | POA: Insufficient documentation

## 2014-05-22 LAB — CBC WITH DIFFERENTIAL/PLATELET
Basophils Absolute: 0 10*3/uL (ref 0.0–0.1)
Basophils Relative: 1 % (ref 0–1)
Eosinophils Absolute: 0.5 10*3/uL (ref 0.0–0.7)
Eosinophils Relative: 6 % — ABNORMAL HIGH (ref 0–5)
HCT: 32.7 % — ABNORMAL LOW (ref 36.0–46.0)
Hemoglobin: 10.1 g/dL — ABNORMAL LOW (ref 12.0–15.0)
Lymphocytes Relative: 32 % (ref 12–46)
Lymphs Abs: 2.5 10*3/uL (ref 0.7–4.0)
MCH: 24.6 pg — ABNORMAL LOW (ref 26.0–34.0)
MCHC: 30.9 g/dL (ref 30.0–36.0)
MCV: 79.8 fL (ref 78.0–100.0)
Monocytes Absolute: 0.6 10*3/uL (ref 0.1–1.0)
Monocytes Relative: 8 % (ref 3–12)
Neutro Abs: 4.1 10*3/uL (ref 1.7–7.7)
Neutrophils Relative %: 53 % (ref 43–77)
Platelets: 294 10*3/uL (ref 150–400)
RBC: 4.1 MIL/uL (ref 3.87–5.11)
RDW: 16.2 % — ABNORMAL HIGH (ref 11.5–15.5)
WBC: 7.7 10*3/uL (ref 4.0–10.5)

## 2014-05-22 LAB — COMPREHENSIVE METABOLIC PANEL
ALT: 8 U/L (ref 0–35)
AST: 14 U/L (ref 0–37)
Albumin: 3.9 g/dL (ref 3.5–5.2)
Alkaline Phosphatase: 34 U/L — ABNORMAL LOW (ref 39–117)
Anion gap: 13 (ref 5–15)
BUN: 9 mg/dL (ref 6–23)
CO2: 22 mEq/L (ref 19–32)
Calcium: 9.2 mg/dL (ref 8.4–10.5)
Chloride: 105 mEq/L (ref 96–112)
Creatinine, Ser: 0.58 mg/dL (ref 0.50–1.10)
GFR calc Af Amer: >90 mL/min (ref >90)
GFR calc non Af Amer: >90 mL/min (ref >90)
Glucose, Bld: 91 mg/dL (ref 70–99)
Potassium: 3.4 mEq/L — ABNORMAL LOW (ref 3.7–5.3)
Sodium: 140 mEq/L (ref 137–147)
Total Bilirubin: 0.4 mg/dL (ref 0.3–1.2)
Total Protein: 7.1 g/dL (ref 6.0–8.3)

## 2014-05-22 LAB — URINALYSIS, ROUTINE W REFLEX MICROSCOPIC
Bilirubin Urine: NEGATIVE
Glucose, UA: NEGATIVE mg/dL
Ketones, ur: 15 mg/dL — AB
Nitrite: NEGATIVE
Protein, ur: NEGATIVE mg/dL
Specific Gravity, Urine: 1.026 (ref 1.005–1.030)
Urobilinogen, UA: 0.2 mg/dL (ref 0.0–1.0)
pH: 5.5 (ref 5.0–8.0)

## 2014-05-22 LAB — RPR

## 2014-05-22 LAB — WET PREP, GENITAL
Trich, Wet Prep: NONE SEEN
Yeast Wet Prep HPF POC: NONE SEEN

## 2014-05-22 LAB — URINE MICROSCOPIC-ADD ON

## 2014-05-22 LAB — HCG, QUANTITATIVE, PREGNANCY: hCG, Beta Chain, Quant, S: 1438 m[IU]/mL — ABNORMAL HIGH (ref <5)

## 2014-05-22 LAB — ABO/RH: ABO/RH(D): O POS

## 2014-05-22 LAB — PREGNANCY, URINE: Preg Test, Ur: POSITIVE — AB

## 2014-05-22 MED ORDER — FERROUS SULFATE 325 (65 FE) MG PO TABS: 325.0000 mg | ORAL_TABLET | Freq: Every day | ORAL | Status: DC

## 2014-05-22 MED ORDER — SODIUM CHLORIDE 0.9 % IV BOLUS (SEPSIS)
1000.0000 mL | Freq: Once | INTRAVENOUS | Status: AC
  Administered 2014-05-22: 1000 mL via INTRAVENOUS

## 2014-05-22 NOTE — ED Notes (Signed)
Pt presents to department for evaluation of lower abdominal cramping and vaginal bleeding. States positive home pregnancy test several weeks ago. 5/10 lower abdominal pain at present. No nausea/vomiting. Pt is alert and oriented x4. NAD.

## 2014-05-22 NOTE — ED Notes (Addendum)
Pt states positive preg test on thanksgiving, vaginal bleeding began on Saturday. States bleeding is very light, some brown discharge and some bright red. Pt states lower left quad pain that was present Saturday and again this morning. Denies nau/vomiting/diarrhea.

## 2014-05-22 NOTE — Discharge Instructions (Signed)
Start taking iron to help avoid any anemia issues. Use tylenol or motrin as needed for pain. See the women's outpatient clinic in 2-3 days for repeat pregnancy hormone level and to evaluate your likely miscarriage. If your symptoms worsen or change, including but not limited to loss of consciousness, dizziness/lightheadedness, worsened bleeding or pain, then return to the women's ER immediately.    Vaginal Bleeding During Pregnancy, First Trimester A small amount of bleeding (spotting) from the vagina is relatively common in early pregnancy. It usually stops on its own. Various things may cause bleeding or spotting in early pregnancy. Some bleeding may be related to the pregnancy, and some may not. In most cases, the bleeding is normal and is not a problem. However, bleeding can also be a sign of something serious. Be sure to tell your health care provider about any vaginal bleeding right away. Some possible causes of vaginal bleeding during the first trimester include:  Infection or inflammation of the cervix.  Growths (polyps) on the cervix.  Miscarriage or threatened miscarriage.  Pregnancy tissue has developed outside of the uterus and in a fallopian tube (tubal pregnancy).  Tiny cysts have developed in the uterus instead of pregnancy tissue (molar pregnancy). HOME CARE INSTRUCTIONS  Watch your condition for any changes. The following actions may help to lessen any discomfort you are feeling:  Follow your health care provider's instructions for limiting your activity. If your health care provider orders bed rest, you may need to stay in bed and only get up to use the bathroom. However, your health care provider may allow you to continue light activity.  If needed, make plans for someone to help with your regular activities and responsibilities while you are on bed rest.  Keep track of the number of pads you use each day, how often you change pads, and how soaked (saturated) they are. Write  this down.  Do not use tampons. Do not douche.  Do not have sexual intercourse or orgasms until approved by your health care provider.  If you pass any tissue from your vagina, save the tissue so you can show it to your health care provider.  Only take over-the-counter or prescription medicines as directed by your health care provider.  Do not take aspirin because it can make you bleed.  Keep all follow-up appointments as directed by your health care provider. SEEK MEDICAL CARE IF:  You have any vaginal bleeding during any part of your pregnancy.  You have cramps or labor pains.  You have a fever, not controlled by medicine. SEEK IMMEDIATE MEDICAL CARE IF:   You have severe cramps in your back or belly (abdomen).  You pass large clots or tissue from your vagina.  Your bleeding increases.  You feel light-headed or weak, or you have fainting episodes.  You have chills.  You are leaking fluid or have a gush of fluid from your vagina.  You pass out while having a bowel movement. MAKE SURE YOU:  Understand these instructions.  Will watch your condition.  Will get help right away if you are not doing well or get worse. Document Released: 03/12/2005 Document Revised: 06/07/2013 Document Reviewed: 02/07/2013 [facility] Patient Information 2015 [place], [place]. This information is not intended to replace advice given to you by your health care provider. Make sure you discuss any questions you have with your health care provider.  Miscarriage A miscarriage is the loss of an unborn baby (fetus) before the 20th week of pregnancy. The cause is  often unknown.  HOME CARE  You may need to stay in bed (bed rest), or you may be able to do light activity. Go about activity as told by your doctor.  Have help at home.  Write down how many pads you use each day. Write down how soaked they are.  Do not use tampons. Do not wash out your vagina (douche) or have sex (intercourse) until  your doctor approves.  Only take medicine as told by your doctor.  Do not take aspirin.  Keep all doctor visits as told.  If you or your partner have problems with grieving, talk to your doctor. You can also try counseling. Give yourself time to grieve before trying to get pregnant again. GET HELP RIGHT AWAY IF:  You have bad cramps or pain in your back or belly (abdomen).  You have a fever.  You pass large clumps of blood (clots) from your vagina that are walnut-sized or larger. Save the clumps for your doctor to see.  You pass large amounts of tissue from your vagina. Save the tissue for your doctor to see.  You have more bleeding.  You have thick, bad-smelling fluid (discharge) coming from the vagina.  You get lightheaded, weak, or you pass out (faint).  You have chills. MAKE SURE YOU:  Understand these instructions.  Will watch your condition.  Will get help right away if you are not doing well or get worse. Document Released: 08/25/2011 Document Reviewed: 08/25/2011 [facility] Patient Information 2015 [place]. This information is not intended to replace advice given to you by your health care provider. Make sure you discuss any questions you have with your health care provider.  Iron Deficiency Anemia Anemia is when you have a low number of healthy red blood cells. It is often caused by too little iron. This is called iron deficiency anemia. It may make you tired and short of breath. HOME CARE   Take iron as told by your doctor.  Take vitamins as told by your doctor.  Eat foods that have iron in them. This includes liver, lean beef, whole-grain bread, eggs, dried fruit, and dark green leafy vegetables. GET HELP RIGHT AWAY IF:  You pass out (faint).  You have chest pain.  You feel sick to your stomach (nauseous) or throw up (vomit).  You get very short of breath with activity.  You are weak.  You have a fast heartbeat.  You start to sweat for no  reason.  You become light-headed when getting up from a chair or bed. MAKE SURE YOU:  Understand these instructions.  Will watch your condition.  Will get help right away if you are not doing well or get worse. Document Released: 07/05/2010 Document Revised: 06/07/2013 Document Reviewed: 02/07/2013 [facility] Patient Information 2015 [place], [place]. This information is not intended to replace advice given to you by your health care provider. Make sure you discuss any questions you have with your health care provider.

## 2014-05-22 NOTE — ED Notes (Signed)
Patient transported to Ultrasound 

## 2014-05-22 NOTE — ED Provider Notes (Signed)
CSN: [id number]     Arrival date & time 05/22/14  1455 History   First MD Initiated Contact with Patient 05/22/14 1706     Chief Complaint  Patient presents with  . Abdominal Pain  . Vaginal Bleeding     (Consider location/radiation/quality/duration/timing/severity/associated sxs/prior Treatment) HPI Comments: Chelsey Cook is a 28 y.o. female [phone] with a PMHx of miscarriage 9yrs ago, who presents to the ED with complaints of abdominal cramping and vaginal bleeding since Saturday. She reports that she has had 3/10 crampy stabbing left lower quadrant abdominal pain which occurs intermittently and is nonradiating and gradually began on Saturday, with no known aggravating factors, and relieved with Aleve. She reports associated vaginal bleeding, states that it is a small amount, using only one pad daily, and began as a brownish blood and then became red, with no clots or tissue passage. She denies any fevers, chills, chest pain, shortness breath, lightheadedness, dizziness, loss of consciousness, syncope, dysuria, hematuria, dyspareunia, constipation, diarrhea, nausea, vomiting, vaginal discharge, myalgias, or arthralgias. She reports a remote history of miscarriage 11 years ago, but states that this pain is less severe than it was at that time. Sexually active with one partner, unprotected, with no OCPs. LMP 04/18/14, and she has regular menses typically. Her prior pregnancy which she carried to term was unremarkable. She had one SAb and one elective Ab. No prior abd surgeries. No sick contacts, EtOH, suspicious food intake, or travel.   Patient is a 28 y.o. female presenting with abdominal pain. The history is provided by the patient. No language interpreter was used.  Abdominal Pain Pain location:  LLQ Pain quality: cramping   Pain radiates to:  Does not radiate Pain severity:  Mild (3/10) Onset quality:  Sudden Duration:  2 days Timing:  Intermittent Progression:  Unchanged Chronicity:   New Context: not recent illness, not recent sexual activity, not recent travel, not sick contacts and not suspicious food intake   Relieved by:  NSAIDs Worsened by:  Nothing tried Ineffective treatments:  None tried Associated symptoms: vaginal bleeding   Associated symptoms: no belching, no chest pain, no chills, no constipation, no diarrhea, no dysuria, no fever, no flatus, no hematemesis, no hematochezia, no hematuria, no melena, no nausea, no shortness of breath, no vaginal discharge and no vomiting   Vaginal bleeding:    Quality:  Bright red   Severity:  Mild   Number of pads used:  1   Onset quality:  Gradual   Duration:  2 days   Progression:  Unchanged   Chronicity:  New Risk factors: NSAID use and pregnancy     History reviewed. No pertinent past medical history. History reviewed. No pertinent past surgical history. No family history on file. History  Substance Use Topics  . Smoking status: Never Smoker   . Smokeless tobacco: Not on file  . Alcohol Use: Yes     Comment: occasionally   OB History    No data available     Review of Systems  Constitutional: Negative for fever and chills.  Respiratory: Negative for shortness of breath.   Cardiovascular: Negative for chest pain.  Gastrointestinal: Positive for abdominal pain. Negative for nausea, vomiting, diarrhea, constipation, blood in stool, melena, hematochezia, flatus and hematemesis.  Genitourinary: Positive for vaginal bleeding. Negative for dysuria, urgency, frequency, hematuria, flank pain, vaginal discharge, difficulty urinating and dyspareunia.  Musculoskeletal: Negative for myalgias and arthralgias.  Skin: Negative for rash.  Allergic/Immunologic: Negative for immunocompromised state.  Neurological: Negative for  dizziness, syncope, weakness, light-headedness and headaches.  Hematological: Does not bruise/bleed easily.  Psychiatric/Behavioral: Negative for confusion.   10 Systems reviewed and are negative  for acute change except as noted in the HPI.    Allergies  Review of patient's allergies indicates no known allergies.  Home Medications   Prior to Admission medications   Medication Sig Start Date End Date Taking? Authorizing Provider  acetaminophen (TYLENOL) 325 MG tablet Take 650 mg by mouth daily as needed for pain.   Yes Historical Provider, MD  bismuth subsalicylate (PEPTO BISMOL) 262 MG chewable tablet Chew 524 mg by mouth as needed for indigestion.   Yes Historical Provider, MD  Loperamide HCl (IMODIUM PO) Take 2 tablets by mouth daily as needed (upset stomach).   Yes Historical Provider, MD  metroNIDAZOLE (FLAGYL) 500 MG tablet Take 1 tablet (500 mg total) by mouth 2 (two) times daily. Patient not taking: Reported on 05/22/2014 03/17/13   Gregor Hams, MD   BP 139/73 mmHg  Pulse 110  Temp(Src) 98.4 F (36.9 C) (Oral)  Resp 18  Ht 5\' 3"  (1.6 m)  Wt 136 lb (61.689 kg)  BMI 24.10 kg/m2  SpO2 99% Physical Exam  Constitutional: She is oriented to person, place, and time. She appears well-developed and well-nourished.  Non-toxic appearance. No distress.  Mildly tachycardic, but afebrile and nontoxic, NAD  HENT:  Head: Normocephalic and atraumatic.  Mouth/Throat: Oropharynx is clear and moist and mucous membranes are normal.  Eyes: Conjunctivae and EOM are normal. Right eye exhibits no discharge. Left eye exhibits no discharge.  Neck: Normal range of motion. Neck supple.  Cardiovascular: Regular rhythm, normal heart sounds and intact distal pulses.  Tachycardia present.  Exam reveals no gallop and no friction rub.   No murmur heard. Mildly tachycardic  Pulmonary/Chest: Effort normal and breath sounds normal. No respiratory distress. She has no decreased breath sounds. She has no wheezes. She has no rhonchi. She has no rales.  Abdominal: Soft. Normal appearance and bowel sounds are normal. She exhibits no distension. There is tenderness in the left lower quadrant. There is no  rigidity, no rebound, no guarding, no CVA tenderness, no tenderness at McBurney's point and negative Murphy's sign.    Soft, ND, +BS throughout, mildly TTP in LLQ, no r/g/r, neg murphy's, neg mcburney's, no CVA TTP, no palpable fundus  Genitourinary: Pelvic exam was performed with patient supine. There is no rash, tenderness or lesion on the right labia. There is no rash, tenderness or lesion on the left labia. Cervix exhibits no discharge and no friability. There is bleeding in the vagina. No erythema or tenderness in the vagina. No vaginal discharge found.  Pelvic exam performed in bed with pt supine and propped up on bedpan, but difficult to perform internal exam in this position. Deferred internal pelvic exam given currently pregnant and will be obtaining U/S. No rashes, lesions, or tenderness to external genitalia. No erythema, injury, or tenderness to vaginal mucosa. No vaginal discharge with scant amount of red blood bleeding within vaginal vault coming from cervical os which is closed. No cervical friability.   Musculoskeletal: Normal range of motion.  Neurological: She is alert and oriented to person, place, and time. She has normal strength. No sensory deficit.  Skin: Skin is warm, dry and intact. No rash noted.  Psychiatric: She has a normal mood and affect.  Nursing note and vitals reviewed.   ED Course  Procedures (including critical care time) Labs Review Labs Reviewed  WET PREP,  GENITAL - Abnormal; Notable for the following:    Clue Cells Wet Prep HPF POC FEW (*)    WBC, Wet Prep HPF POC MODERATE (*)    All other components within normal limits  CBC WITH DIFFERENTIAL - Abnormal; Notable for the following:    Hemoglobin 10.1 (*)    HCT 32.7 (*)    MCH 24.6 (*)    RDW 16.2 (*)    Eosinophils Relative 6 (*)    All other components within normal limits  COMPREHENSIVE METABOLIC PANEL - Abnormal; Notable for the following:    Potassium 3.4 (*)    Alkaline Phosphatase 34 (*)      All other components within normal limits  URINALYSIS, ROUTINE W REFLEX MICROSCOPIC - Abnormal; Notable for the following:    Hgb urine dipstick LARGE (*)    Ketones, ur 15 (*)    Leukocytes, UA TRACE (*)    All other components within normal limits  HCG, QUANTITATIVE, PREGNANCY - Abnormal; Notable for the following:    hCG, Beta Chain, Quant, S 1438 (*)    All other components within normal limits  PREGNANCY, URINE - Abnormal; Notable for the following:    Preg Test, Ur POSITIVE (*)    All other components within normal limits  URINE MICROSCOPIC-ADD ON - Abnormal; Notable for the following:    Squamous Epithelial / LPF MANY (*)    Bacteria, UA FEW (*)    All other components within normal limits  GC/CHLAMYDIA PROBE AMP  URINE CULTURE  RPR  HIV ANTIBODY (ROUTINE TESTING)  ABO/RH    Imaging Review US Ob Comp Less 14 Wks  05/22/2014   CLINICAL DATA:  Positive pregnancy test, vaginal bleeding, quantitative beta HCG 1,438  EXAM: OBSTETRIC <14 WK [place] AND TRANSVAGINAL OB US  TECHNIQUE: Both transabdominal and transvaginal ultrasound examinations were performed for complete evaluation of the gestation as well as the maternal uterus, adnexal regions, and pelvic cul-de-sac. Transvaginal technique was performed to assess early pregnancy.  COMPARISON:  12/05/2003  FINDINGS: Intrauterine gestational sac: Not visualized  Yolk sac:  Not visualized  Embryo:  Not visualized  Cardiac Activity: Not visualized  Maternal uterus/adnexae: No visualized intrauterine gestational sac or pregnancy. Endometrial measures 4.6 mm. Small amount of fluid in the endometrial cavity. Small cervical nabothian cyst evident.  Small amount of pelvic free fluid.  Uterus measures 9.5 x 5.1 x 6.4 cm. Scattered uterine fibroids noted one measuring 1.4 x 0.8 x 1.3 cm in the right aspect of the uterus.  Right ovary measures 2.9 x 3.2 x 4.7 cm. Right ovary contains a 2.8 x 2.6 x 3.1 cm complex septated cyst, possibly hemorrhagic.   Left ovary measures 2.6 x 2.8 x 3.6 cm and contains a 1.9 cm minimally complex cyst with a slightly thickened wall.  IMPRESSION: No visualized intrauterine pregnancy.  Small amount of free fluid  Bilateral minimally complex ovarian cysts.  Recommend short-term follow-up pelvic ultrasound and correlation with serial quantitative beta HCG to document pregnancy viability and to completely exclude ectopic.   Electronically Signed   By: Daryll Brod M.D.   On: 05/22/2014 20:07   US Ob Transvaginal  05/22/2014   CLINICAL DATA:  Positive pregnancy test, vaginal bleeding, quantitative beta HCG 1,438  EXAM: OBSTETRIC <14 WK [place] AND TRANSVAGINAL OB US  TECHNIQUE: Both transabdominal and transvaginal ultrasound examinations were performed for complete evaluation of the gestation as well as the maternal uterus, adnexal regions, and pelvic cul-de-sac. Transvaginal technique was performed to assess  early pregnancy.  COMPARISON:  12/05/2003  FINDINGS: Intrauterine gestational sac: Not visualized  Yolk sac:  Not visualized  Embryo:  Not visualized  Cardiac Activity: Not visualized  Maternal uterus/adnexae: No visualized intrauterine gestational sac or pregnancy. Endometrial measures 4.6 mm. Small amount of fluid in the endometrial cavity. Small cervical nabothian cyst evident.  Small amount of pelvic free fluid.  Uterus measures 9.5 x 5.1 x 6.4 cm. Scattered uterine fibroids noted one measuring 1.4 x 0.8 x 1.3 cm in the right aspect of the uterus.  Right ovary measures 2.9 x 3.2 x 4.7 cm. Right ovary contains a 2.8 x 2.6 x 3.1 cm complex septated cyst, possibly hemorrhagic.  Left ovary measures 2.6 x 2.8 x 3.6 cm and contains a 1.9 cm minimally complex cyst with a slightly thickened wall.  IMPRESSION: No visualized intrauterine pregnancy.  Small amount of free fluid  Bilateral minimally complex ovarian cysts.  Recommend short-term follow-up pelvic ultrasound and correlation with serial quantitative beta HCG to document pregnancy  viability and to completely exclude ectopic.   Electronically Signed   By: Daryll Brod M.D.   On: 05/22/2014 20:07     EKG Interpretation None      MDM   Final diagnoses:  Vaginal bleeding in pregnancy  LLQ abdominal pain  Spontaneous miscarriage  Anemia, unspecified anemia type    28 y.o. female here with lower abd pain and vaginal bleeding, assumed to be pregnant but Upreg pending. CBC showing baseline anemia, no currently symptoms of anemia today. CMP WNL. Will obtain STD panel, early vaginal bleeding panel labs and perform pelvic, but await on pelvic U/S until Upreg returns. Likely will need u/s. Pt declines wanting pain meds. Will reassess shortly.   7:19 PM Upreg positive, ordered U/S which is pending. Pelvic demonstrating closed os without tissue passage, internal exam not performed. U/A contaminated, doubt UTI, will send for culture. Rh+ therefore no rhogam needed. Quant HCG 1438. Awaiting U/S.   8:52 PM VS remain stable and tachycardia improved after bolus. Wet prep showing few clue cells but doubt BV since pt asymptomatic. U/S reveals no IUP or ectopic, small amount of free fluid, and b/l complex ovarian cysts. Will have her see women's outpt in 2-3 days for repeat quant HCG. Discussed strict return precautions including increased bleeding, syncope/dizziness, or increased pain, and to see [facility] for those. Otherwise will have her see outpt clinic in 2-3 days. Discussed tylenol or motrin for pain. Started on Iron for anemia. I explained the diagnosis and have given explicit precautions to return to the ER including for any other new or worsening symptoms. The patient understands and accepts the medical plan as it's been dictated and I have answered their questions. Discharge instructions concerning home care and prescriptions have been given. The patient is STABLE and is discharged to home in good condition.  BP 127/73 mmHg  Pulse 82  Temp(Src) 98.4 F (36.9 C) (Oral)   Resp 18  Ht 5\' 3"  (1.6 m)  Wt 136 lb (61.689 kg)  BMI 24.10 kg/m2  SpO2 100%  Meds ordered this encounter  Medications  . sodium chloride 0.9 % bolus 1,000 mL    Sig:   . ferrous sulfate 325 (65 FE) MG tablet    Sig: Take 1 tablet (325 mg total) by mouth daily.    Dispense:  30 tablet    Refill:  0    Order Specific Question:  Supervising Provider    Answer:  Noemi Chapel D [[id number]]  Patty Sermons [place], [place] 27/61/84 [id number]  Delora Fuel, MD 76/39/43 2003

## 2014-05-23 LAB — GC/CHLAMYDIA PROBE AMP
CT Probe RNA: NEGATIVE
GC Probe RNA: NEGATIVE

## 2014-05-23 LAB — HIV ANTIBODY (ROUTINE TESTING W REFLEX): HIV 1&2 Ab, 4th Generation: NONREACTIVE

## 2014-05-24 ENCOUNTER — Other Ambulatory Visit: Payer: BC Managed Care – PPO

## 2014-05-24 DIAGNOSIS — N939 Abnormal uterine and vaginal bleeding, unspecified: Secondary | ICD-10-CM

## 2014-05-24 LAB — URINE CULTURE: Colony Count: 30000

## 2014-05-24 NOTE — Progress Notes (Unsigned)
Patient here today for follow up HCG. Patient asked what the plan is now. Informed patient that once results are obtained and reviewed by a provider, they will inform us whether or not further follow up is necessary (repeat HCG, ultrasound etc.) and we will call her with plan. Ensured patient was not experiencing any pain or heavy bleeding. Patient denies both and verbalized understanding and gratitude. No further questions or concerns.

## 2014-05-25 ENCOUNTER — Inpatient Hospital Stay (HOSPITAL_COMMUNITY): Payer: Self-pay

## 2014-05-25 ENCOUNTER — Inpatient Hospital Stay (HOSPITAL_COMMUNITY)
Admission: AD | Admit: 2014-05-25 | Discharge: 2014-05-25 | Disposition: A | Discharge status: Home / Self Care | Payer: Self-pay | Source: Ambulatory Visit | Attending: Obstetrics & Gynecology | Admitting: Obstetrics & Gynecology

## 2014-05-25 ENCOUNTER — Telehealth: Payer: Self-pay | Admitting: General Practice

## 2014-05-25 ENCOUNTER — Encounter (HOSPITAL_COMMUNITY): Payer: Self-pay | Admitting: *Deleted

## 2014-05-25 DIAGNOSIS — O200 Threatened abortion: Secondary | ICD-10-CM

## 2014-05-25 DIAGNOSIS — O26851 Spotting complicating pregnancy, first trimester: Secondary | ICD-10-CM

## 2014-05-25 DIAGNOSIS — R109 Unspecified abdominal pain: Secondary | ICD-10-CM | POA: Insufficient documentation

## 2014-05-25 DIAGNOSIS — O26891 Other specified pregnancy related conditions, first trimester: Secondary | ICD-10-CM

## 2014-05-25 DIAGNOSIS — O468X1 Other antepartum hemorrhage, first trimester: Secondary | ICD-10-CM

## 2014-05-25 DIAGNOSIS — R102 Pelvic and perineal pain: Secondary | ICD-10-CM

## 2014-05-25 DIAGNOSIS — O418X10 Other specified disorders of amniotic fluid and membranes, first trimester, not applicable or unspecified: Secondary | ICD-10-CM

## 2014-05-25 DIAGNOSIS — Z3A01 Less than 8 weeks gestation of pregnancy: Secondary | ICD-10-CM | POA: Insufficient documentation

## 2014-05-25 LAB — HCG, QUANTITATIVE, PREGNANCY: hCG, Beta Chain, Quant, S: 2990.2 m[IU]/mL

## 2014-05-25 NOTE — MAU Provider Note (Signed)
  History     CSN: [id number]  Arrival date and time: 05/25/14 4481   First Provider Initiated Contact with Patient 05/25/14 2011      No chief complaint on file.  HPI  Chelsey Cook is a 28 y.o. [phone] at [redacted]w[redacted]d who presents today with abdominal pain and vaginal bleeding. She has had an [place] that showed GS, but no YS, and she had two HCG levels 48 hours apart. They were rising as expected. However, she has continued to have pain and bleeding. She states that her pain is 5/10 at this time, and she is having dark red blood.   No past medical history on file.  No past surgical history on file.  No family history on file.  History  Substance Use Topics  . Smoking status: Never Smoker   . Smokeless tobacco: Not on file  . Alcohol Use: Yes     Comment: occasionally    Allergies: No Known Allergies  Prescriptions prior to admission  Medication Sig Dispense Refill Last Dose  . naproxen sodium (ANAPROX) 220 MG tablet Take 440 mg by mouth daily as needed (For pain.).   Past Week at Unknown time  . acetaminophen (TYLENOL) 325 MG tablet Take 650 mg by mouth daily as needed for pain.   PRN  . ferrous sulfate 325 (65 FE) MG tablet Take 1 tablet (325 mg total) by mouth daily. (Patient not taking: Reported on 05/25/2014) 30 tablet 0 Has not started at Unknown time  . metroNIDAZOLE (FLAGYL) 500 MG tablet Take 1 tablet (500 mg total) by mouth 2 (two) times daily. (Patient not taking: Reported on 05/22/2014) 14 tablet 0 Completed Course at Unknown time    ROS Pertinent positive ROS in HPI.  Denies Chest pain, SOB, Headache, fever, weakness. Physical Exam   Blood pressure 114/82, pulse 92, temperature 99.2 F (37.3 C), temperature source Oral, resp. rate 20, height 5\' 2"  (1.575 m), weight 64.071 kg (141 lb 4 oz), last menstrual period 04/18/2014.  Physical Exam  Nursing note and vitals reviewed. Constitutional: She is oriented to person, place, and time. She appears well-developed and  well-nourished. No distress.  Cardiovascular: Normal rate.   Respiratory: Effort normal.  GI: Soft. There is no tenderness. There is no rebound.  Neurological: She is alert and oriented to person, place, and time.  Skin: Skin is warm and dry.  Psychiatric: She has a normal mood and affect.    MAU Course  Procedures  2055: Care turned over to K. Teague-Clark, PA [place] pending Yolk sac seen on U/S as well as subchorionic hemorrhage - consistent with bleeding.    Mathis Bud 05/25/2014 8:56 PM   Assessment and Plan  A:  Abdominal pain and vaginal bleeding in pregnancy Subchorionic hemorrhage Threatened AB  P: Discharge to home PNV qd Obtain [facility] asap Patient may return to MAU as needed or if her condition were to change or worsen

## 2014-05-25 NOTE — MAU Note (Signed)
PT SAYS SHE WENT  TO [facility] ON Monday-  FOR VAG  BLEEDING    AND CRAMPING- LABS  AND U/S-     TOLD HER  GET LABS  YESTERDAY-    THEY SAID  DOUBLED.   SO SHE HAS CONTINUED  TO BLEED.     SAYS LAST NIGHT  HER LEFT ARM AND   SHOULDER   HURTS   AND   CRAMPS IN ABD    .  SAYS  BLOOD  IS  COLOR OF PRUNE  JUICE.

## 2014-05-25 NOTE — Telephone Encounter (Signed)
Patient called and left message requesting bhcg results from yesterday. Had Dr Ihor Dow review labs, bhcg is rising appropriately and to inquire about patient's bleeding and pain. Called patient back and informed her of results and asked if she was having bleeding or pain still. Patient states she still spots some brown blood and her pain is about a 5/10 which has increased. Recommended patient go to MAU for further follow up since her pain has increased we want to make sure she doesn't have an ectopic pregnancy. Patient verbalized understanding and had no questions

## 2014-05-25 NOTE — Discharge Instructions (Signed)
Pelvic Rest °Pelvic rest is sometimes recommended for women when:  °· The placenta is partially or completely covering the opening of the cervix (placenta previa). °· There is bleeding between the uterine wall and the amniotic sac in the first trimester (subchorionic hemorrhage). °· The cervix begins to open without labor starting (incompetent cervix, cervical insufficiency). °· The labor is too early (preterm labor). °HOME CARE INSTRUCTIONS °· Do not have sexual intercourse, stimulation, or an orgasm. °· Do not use tampons, douche, or put anything in the vagina. °· Do not lift anything over 10 pounds (4.5 kg). °· Avoid strenuous activity or straining your pelvic muscles. °SEEK MEDICAL CARE IF:  °· You have any vaginal bleeding during pregnancy. Treat this as a potential emergency. °· You have cramping pain felt low in the stomach (stronger than menstrual cramps). °· You notice vaginal discharge (watery, mucus, or bloody). °· You have a low, dull backache. °· There are regular contractions or uterine tightening. °SEEK IMMEDIATE MEDICAL CARE IF: °You have vaginal bleeding and have placenta previa.  °Document Released: 09/27/2010 Document Revised: 08/25/2011 Document Reviewed: 09/27/2010 °ExitCare® Patient Information ©2015 [employer]. This information is not intended to replace advice given to you by your health care provider. Make sure you discuss any questions you have with your health care provider. ° °Subchorionic Hematoma °A subchorionic hematoma is a gathering of blood between the outer wall of the placenta and the inner wall of the womb (uterus). The placenta is the organ that connects the fetus to the wall of the uterus. The placenta performs the feeding, breathing (oxygen to the fetus), and waste removal (excretory work) of the fetus.  °Subchorionic hematoma is the most common abnormality found on a result from ultrasonography done during the first trimester or early second trimester of pregnancy. If  there has been little or no vaginal bleeding, early small hematomas usually shrink on their own and do not affect your baby or pregnancy. The blood is gradually absorbed over 1-2 weeks. When bleeding starts later in pregnancy or the hematoma is larger or occurs in an older pregnant woman, the outcome may not be as good. Larger hematomas may get bigger, which increases the chances for miscarriage. Subchorionic hematoma also increases the risk of premature detachment of the placenta from the uterus, preterm (premature) labor, and stillbirth. °HOME CARE INSTRUCTIONS °· Stay on bed rest if your health care provider recommends this. Although bed rest will not prevent more bleeding or prevent a miscarriage, your health care provider may recommend bed rest until you are advised otherwise. °· Avoid heavy lifting (more than 10 lb [4.5 kg]), exercise, sexual intercourse, or douching as directed by your health care provider. °· Keep track of the number of pads you use each day and how soaked (saturated) they are. Write down this information. °· Do not use tampons. °· Keep all follow-up appointments as directed by your health care provider. Your health care provider may ask you to have follow-up blood tests or ultrasound tests or both. °SEEK IMMEDIATE MEDICAL CARE IF: °· You have severe cramps in your stomach, back, abdomen, or pelvis. °· You have a fever. °· You pass large clots or tissue. Save any tissue for your health care provider to look at. °· Your bleeding increases or you become lightheaded, feel weak, or have fainting episodes. °Document Released: 09/17/2006 Document Revised: 10/17/2013 Document Reviewed: 12/30/2012 °ExitCare® Patient Information ©2015 [employer]. This information is not intended to replace advice given to you by your health care provider.   sure you discuss any questions you have with your health care provider.

## 2014-07-03 ENCOUNTER — Inpatient Hospital Stay (HOSPITAL_COMMUNITY)
Admission: AD | Admit: 2014-07-03 | Discharge: 2014-07-03 | Disposition: A | Discharge status: Home / Self Care | Payer: Commercial Managed Care - PPO | Source: Ambulatory Visit | Attending: Family Medicine | Admitting: Family Medicine

## 2014-07-03 ENCOUNTER — Encounter (HOSPITAL_COMMUNITY): Payer: Self-pay | Admitting: *Deleted

## 2014-07-03 DIAGNOSIS — Z9889 Other specified postprocedural states: Secondary | ICD-10-CM

## 2014-07-03 DIAGNOSIS — Z332 Encounter for elective termination of pregnancy: Secondary | ICD-10-CM | POA: Insufficient documentation

## 2014-07-03 DIAGNOSIS — N920 Excessive and frequent menstruation with regular cycle: Secondary | ICD-10-CM

## 2014-07-03 DIAGNOSIS — N898 Other specified noninflammatory disorders of vagina: Secondary | ICD-10-CM

## 2014-07-03 DIAGNOSIS — Z09 Encounter for follow-up examination after completed treatment for conditions other than malignant neoplasm: Secondary | ICD-10-CM | POA: Diagnosis not present

## 2014-07-03 HISTORY — DX: Unspecified abnormal cytological findings in specimens from vagina: R87.629

## 2014-07-03 LAB — POCT PREGNANCY, URINE: Preg Test, Ur: NEGATIVE

## 2014-07-03 LAB — HCG, QUANTITATIVE, PREGNANCY: hCG, Beta Chain, Quant, S: 6 m[IU]/mL — ABNORMAL HIGH (ref <5)

## 2014-07-03 NOTE — MAU Note (Addendum)
Terminated a pregnancy on 12/14. (procedure), no follow up. Had a positive home test today.  Started birth control pills on 12/20. Has had just a couple spots of blood.

## 2014-07-03 NOTE — Discharge Instructions (Signed)
Oral Contraception Use Oral contraceptive pills (OCPs) are medicines taken to prevent pregnancy. OCPs work by preventing the ovaries from releasing eggs. The hormones in OCPs also cause the cervical mucus to thicken, preventing the sperm from entering the uterus. The hormones also cause the uterine lining to become thin, not allowing a fertilized egg to attach to the inside of the uterus. OCPs are highly effective when taken exactly as prescribed. However, OCPs do not prevent sexually transmitted diseases (STDs). Safe sex practices, such as using condoms along with an OCP, can help prevent STDs. Before taking OCPs, you may have a physical exam and Pap test. Your health care provider may also order blood tests if necessary. Your health care provider will make sure you are a good candidate for oral contraception. Discuss with your health care provider the possible side effects of the OCP you may be prescribed. When starting an OCP, it can take 2 to 3 months for the body to adjust to the changes in hormone levels in your body.  HOW TO TAKE ORAL CONTRACEPTIVE PILLS Your health care provider may advise you on how to start taking the first cycle of OCPs. Otherwise, you can:   Start on day 1 of your menstrual period. You will not need any backup contraceptive protection with this start time.   Start on the first Sunday after your menstrual period or the day you get your prescription. In these cases, you will need to use backup contraceptive protection for the first week.   Start the pill at any time of your cycle. If you take the pill within 5 days of the start of your period, you are protected against pregnancy right away. In this case, you will not need a backup form of birth control. If you start at any other time of your menstrual cycle, you will need to use another form of birth control for 7 days. If your OCP is the type called a minipill, it will protect you from pregnancy after taking it for 2 days (48  hours). After you have started taking OCPs:   If you forget to take 1 pill, take it as soon as you remember. Take the next pill at the regular time.   If you miss 2 or more pills, call your health care provider because different pills have different instructions for missed doses. Use backup birth control until your next menstrual period starts.   If you use a 28-day pack that contains inactive pills and you miss 1 of the last 7 pills (pills with no hormones), it will not matter. Throw away the rest of the non-hormone pills and start a new pill pack.  No matter which day you start the OCP, you will always start a new pack on that same day of the week. Have an extra pack of OCPs and a backup contraceptive method available in case you miss some pills or lose your OCP pack.  HOME CARE INSTRUCTIONS   Do not smoke.   Always use a condom to protect against STDs. OCPs do not protect against STDs.   Use a calendar to mark your menstrual period days.   Read the information and directions that came with your OCP. Talk to your health care provider if you have questions.  SEEK MEDICAL CARE IF:   You develop nausea and vomiting.   You have abnormal vaginal discharge or bleeding.   You develop a rash.   You miss your menstrual period.   You are losing   your hair.   You need treatment for mood swings or depression.   You get dizzy when taking the OCP.   You develop acne from taking the OCP.   You become pregnant.  SEEK IMMEDIATE MEDICAL CARE IF:   You develop chest pain.   You develop shortness of breath.   You have an uncontrolled or severe headache.   You develop numbness or slurred speech.   You develop visual problems.   You develop pain, redness, and swelling in the legs.  Document Released: 05/22/2011 Document Revised: 10/17/2013 Document Reviewed: 11/21/2012 ExitCare Patient Information 2015 [employer]. This information is not intended to replace  advice given to you by your health care provider. Make sure you discuss any questions you have with your health care provider.  

## 2014-07-03 NOTE — MAU Provider Note (Signed)
  History     CSN: [id number]  Arrival date and time: 07/03/14 7741   First Provider Initiated Contact with Patient 07/03/14 1901      Chief Complaint  Patient presents with  . Possible Pregnancy   HPI This is a 29 y.o. female who presents with c/o positive pregnancy test at home. She had a surgical abortion a month ago and has been on oral contraceptives. Is on the last row of pills. Has had sex once. States she thought period was late, so took a test. Brought it with her, does not appear to have 2 lines, though patient thinks she sees 2 lines. States abortion clinic in [place] "did not tell me anything about what to expect, they didn't seem to know what they were doing".     OB History    Gravida Para Term Preterm AB TAB SAB Ectopic Multiple Living   4 1 1  2  1   1       Past Medical History  Diagnosis Date  . Vaginal Pap smear, abnormal     Past Surgical History  Procedure Laterality Date  . Therapeutic abortion    . Colposcopy    . Wisdom tooth extraction      History reviewed. No pertinent family history.  History  Substance Use Topics  . Smoking status: Never Smoker   . Smokeless tobacco: Not on file  . Alcohol Use: Yes     Comment: occasionally    Allergies: No Known Allergies  Prescriptions prior to admission  Medication Sig Dispense Refill Last Dose  . acetaminophen (TYLENOL) 325 MG tablet Take 650 mg by mouth daily as needed for pain.   PRN    Review of Systems  Constitutional: Negative for fever, chills and malaise/fatigue.  Gastrointestinal: Negative for nausea, vomiting and abdominal pain.  Neurological: Negative for dizziness.   Physical Exam   Blood pressure 145/79, pulse 81, temperature 98.2 F (36.8 C), temperature source Oral, resp. rate 16, weight 140 lb (63.504 kg), last menstrual period 04/18/2014, unknown if currently breastfeeding.  Physical Exam  Constitutional: She is oriented to person, place, and time. She appears  well-developed and well-nourished. No distress.  Cardiovascular: Normal rate.   Respiratory: Effort normal.  Genitourinary:  Declines exam   Musculoskeletal: Normal range of motion.  Neurological: She is alert and oriented to person, place, and time.  Skin: Skin is warm and dry.  Psychiatric: She has a normal mood and affect.    MAU Course  Procedures  MDM Urine pregnancy test negative here Quant HCG ordered  Results for [place], Vonne I (MRN [id number]) as of 07/06/2014 15:51  Ref. Range 07/03/2014 19:05  hCG, Beta Chain, Quant, S Latest Range: <5 mIU/mL 6 (H)   Assessment and Plan  A:  S/P elective abortion       Quant HCG level of 6  P:  Discussed with Dr Nehemiah Settle       Probably remnant HCG from Abortion       Advised to retake UPT at home next week       It this is a new pregnancy, UPT should be positive by next week.   [facility] 07/03/2014, 7:38 PM

## 2014-08-14 ENCOUNTER — Encounter (HOSPITAL_COMMUNITY): Payer: Self-pay | Admitting: Family Medicine

## 2014-08-14 ENCOUNTER — Emergency Department (HOSPITAL_COMMUNITY)
Admission: EM | Admit: 2014-08-14 | Discharge: 2014-08-14 | Disposition: A | Discharge status: Home / Self Care | Payer: Commercial Managed Care - PPO | Attending: Emergency Medicine | Admitting: Emergency Medicine

## 2014-08-14 DIAGNOSIS — Y9289 Other specified places as the place of occurrence of the external cause: Secondary | ICD-10-CM | POA: Insufficient documentation

## 2014-08-14 DIAGNOSIS — T7840XA Allergy, unspecified, initial encounter: Secondary | ICD-10-CM | POA: Diagnosis not present

## 2014-08-14 DIAGNOSIS — X58XXXA Exposure to other specified factors, initial encounter: Secondary | ICD-10-CM | POA: Diagnosis not present

## 2014-08-14 DIAGNOSIS — Z793 Long term (current) use of hormonal contraceptives: Secondary | ICD-10-CM | POA: Diagnosis not present

## 2014-08-14 DIAGNOSIS — Y9389 Activity, other specified: Secondary | ICD-10-CM | POA: Diagnosis not present

## 2014-08-14 DIAGNOSIS — Y998 Other external cause status: Secondary | ICD-10-CM | POA: Diagnosis not present

## 2014-08-14 DIAGNOSIS — R21 Rash and other nonspecific skin eruption: Secondary | ICD-10-CM | POA: Diagnosis present

## 2014-08-14 MED ORDER — DIPHENHYDRAMINE HCL 25 MG PO TABS
50.0000 mg | ORAL_TABLET | ORAL | Status: DC | PRN
Start: 2014-08-14 — End: 2019-11-10

## 2014-08-14 MED ORDER — RANITIDINE HCL 150 MG PO TABS: 150.0000 mg | ORAL_TABLET | Freq: Two times a day (BID) | ORAL | Status: DC

## 2014-08-14 MED ORDER — HYDROCORTISONE 1 % EX CREA: 1.0000 "application " | TOPICAL_CREAM | Freq: Two times a day (BID) | CUTANEOUS | Status: DC

## 2014-08-14 NOTE — ED Provider Notes (Signed)
CSN: [id number]     Arrival date & time 08/14/14  1440 History  This chart was scribed for non-physician practitioner Comer Locket, working with No att. providers found by Donato Schultz, ED Scribe. This patient was seen in room TR07C/TR07C and the patient's care was started at 3:45 PM.   Chief Complaint  Patient presents with  . Allergic Reaction   Patient is a 29 y.o. female presenting with allergic reaction. The history is provided by the patient. No language interpreter was used.  Allergic Reaction Presenting symptoms: rash   Presenting symptoms: no difficulty swallowing    HPI Comments: Chelsey Cook is a 29 y.o. female who presents to the Emergency Department complaining of small, itchy, erythematous bumps on her face, chest, legs and arms that started two days ago.  She rates the itchiness 2/10 currently.  She states that she went out of town to visit a friend and slept on her couch.  She has not tried any new foods, lotions, or detergents however her friend did have a dog.  She is not sure whether or not she has an allergy to pet dander.  She took an OTC allergy medication, not Benadryl, with no relief to her symptoms.  She denies SOB, chest pain, fever, nausea, vomiting, abdominal pain, throat or facial swelling, and tongue swelling as associated symptoms.  She denies sick contacts.   Past Medical History  Diagnosis Date  . Vaginal Pap smear, abnormal    Past Surgical History  Procedure Laterality Date  . Therapeutic abortion    . Colposcopy    . Wisdom tooth extraction     History reviewed. No pertinent family history. History  Substance Use Topics  . Smoking status: Never Smoker   . Smokeless tobacco: Not on file  . Alcohol Use: Yes     Comment: occasionally   OB History    Gravida Para Term Preterm AB TAB SAB Ectopic Multiple Living   4 1 1  3 1 1   1      Review of Systems  Constitutional: Negative for fever.  HENT: Negative for congestion, facial swelling,  rhinorrhea and trouble swallowing.   Respiratory: Negative for cough and shortness of breath.   Cardiovascular: Negative for chest pain.  Skin: Positive for rash.  All other systems reviewed and are negative.     Allergies  Review of patient's allergies indicates no known allergies.  Home Medications   Prior to Admission medications   Medication Sig Start Date End Date Taking? Authorizing Provider  acetaminophen (TYLENOL) 325 MG tablet Take 650 mg by mouth daily as needed for pain.    Historical Provider, MD  diphenhydrAMINE (BENADRYL) 25 MG tablet Take 2 tablets (50 mg total) by mouth every 4 (four) hours as needed for itching. 08/14/14   Viona Gilmore Kivon Aprea, PA-C  hydrocortisone cream 1 % Apply 1 application topically 2 (two) times daily. 08/14/14   Verl Dicker, PA-C  norethindrone-ethinyl estradiol-iron (MICROGESTIN FE,GILDESS FE,LOESTRIN FE) 1.5-30 MG-MCG tablet Take 1 tablet by mouth daily.    Historical Provider, MD  ranitidine (ZANTAC) 150 MG tablet Take 1 tablet (150 mg total) by mouth 2 (two) times daily. 08/14/14   Verl Dicker, PA-C   Triage Vitals: BP 104/78 mmHg  Pulse 108  Temp(Src) 99.4 F (37.4 C) (Oral)  Resp 18  SpO2 99%  LMP 07/31/2014  Physical Exam  Constitutional: She is oriented to person, place, and time. She appears well-developed and well-nourished.  HENT:  Head: Normocephalic  and atraumatic.  Mouth/Throat: Oropharynx is clear and moist.  Eyes: Conjunctivae and EOM are normal. Pupils are equal, round, and reactive to light. Right eye exhibits no discharge. Left eye exhibits no discharge. No scleral icterus.  Neck: Normal range of motion. Neck supple.  Cardiovascular: Normal rate, regular rhythm and normal heart sounds.   Pulmonary/Chest: Effort normal and breath sounds normal. No respiratory distress. She has no wheezes. She has no rales.  Abdominal: Soft. There is no tenderness.  Musculoskeletal: Normal range of motion. She exhibits no  tenderness.  Neurological: She is alert and oriented to person, place, and time.  Cranial Nerves II-XII grossly intact  Skin: Skin is warm and dry. No rash noted.  Diffuse maculopapular rash throughout trunk and extremities, face and forehead. No bites appreciated to middle of lesions. No active drainage or crusting. No scaling. No excoriations  Psychiatric: She has a normal mood and affect. Her behavior is normal.  Nursing note and vitals reviewed.   ED Course  Procedures (including critical care time)  DIAGNOSTIC STUDIES: Oxygen Saturation is 99% on room air, normal by my interpretation.    COORDINATION OF CARE: 3:48 PM- Will treat patient with Prilosec and Benadryl and the patient agreed to the treatment plan.  Labs Review Labs Reviewed - No data to display  Imaging Review No results found.   EKG Interpretation None     Meds given in ED:  Medications - No data to display  Discharge Medication List as of 08/14/2014  3:57 PM    START taking these medications   Details  diphenhydrAMINE (BENADRYL) 25 MG tablet Take 2 tablets (50 mg total) by mouth every 4 (four) hours as needed for itching., Starting 08/14/2014, Until Discontinued, Print    hydrocortisone cream 1 % Apply 1 application topically 2 (two) times daily., Starting 08/14/2014, Until Discontinued, Print    ranitidine (ZANTAC) 150 MG tablet Take 1 tablet (150 mg total) by mouth 2 (two) times daily., Starting 08/14/2014, Until Discontinued, Print       Filed Vitals:   08/14/14 1450 08/14/14 1603  BP: 104/78 106/80  Pulse: 108   Temp: 99.4 F (37.4 C) 98.9 F (37.2 C)  TempSrc: Oral Oral  Resp: 18 16  SpO2: 99% 98%    MDM  Vitals stable - WNL -afebrile Pt resting comfortably in ED. PE--not concerning further acute or emergent pathology  DDX-patient likely experiencing allergic reaction secondary to contact dermatitis or bedbugs. No evidence of scabies. No evidence of anaphylaxis. Will treat  symptomatically with Benadryl, Zantac and hydrocortisone cream.  I discussed all relevant lab findings and imaging results with pt and they verbalized understanding. Discussed f/u with PCP within 48 hrs and return precautions, pt very amenable to plan.  Final diagnoses:  Allergic reaction, initial encounter   I personally performed the services described in this documentation, which was scribed in my presence. The recorded information has been reviewed and is accurate.    Viona Gilmore Baldwin, PA-C 08/14/14 [place], MD 08/15/14 [phone]

## 2014-08-14 NOTE — ED Notes (Signed)
Pt having rash that started Saturday. Pt red areas to arms and leg and chest. Denies new foods, lotions, detergents.

## 2015-03-02 ENCOUNTER — Emergency Department (HOSPITAL_COMMUNITY)
Admission: EM | Admit: 2015-03-02 | Discharge: 2015-03-02 | Disposition: A | Discharge status: Home / Self Care | Payer: Commercial Managed Care - PPO | Attending: Emergency Medicine | Admitting: Emergency Medicine

## 2015-03-02 ENCOUNTER — Encounter (HOSPITAL_COMMUNITY): Payer: Self-pay | Admitting: *Deleted

## 2015-03-02 DIAGNOSIS — Z3202 Encounter for pregnancy test, result negative: Secondary | ICD-10-CM | POA: Insufficient documentation

## 2015-03-02 DIAGNOSIS — Z79899 Other long term (current) drug therapy: Secondary | ICD-10-CM | POA: Insufficient documentation

## 2015-03-02 DIAGNOSIS — R350 Frequency of micturition: Secondary | ICD-10-CM | POA: Diagnosis present

## 2015-03-02 DIAGNOSIS — N3000 Acute cystitis without hematuria: Secondary | ICD-10-CM | POA: Diagnosis not present

## 2015-03-02 LAB — URINALYSIS, ROUTINE W REFLEX MICROSCOPIC
Bilirubin Urine: NEGATIVE
Glucose, UA: NEGATIVE mg/dL
Hgb urine dipstick: NEGATIVE
Ketones, ur: NEGATIVE mg/dL
Nitrite: NEGATIVE
Protein, ur: NEGATIVE mg/dL
Specific Gravity, Urine: 1.011 (ref 1.005–1.030)
Urobilinogen, UA: 1 mg/dL (ref 0.0–1.0)
pH: 7.5 (ref 5.0–8.0)

## 2015-03-02 LAB — URINE MICROSCOPIC-ADD ON

## 2015-03-02 LAB — POC URINE PREG, ED: Preg Test, Ur: NEGATIVE

## 2015-03-02 MED ORDER — CIPROFLOXACIN HCL 500 MG PO TABS: 500.0000 mg | ORAL_TABLET | Freq: Two times a day (BID) | ORAL | Status: DC

## 2015-03-02 MED ORDER — PHENAZOPYRIDINE HCL 200 MG PO TABS: 200.0000 mg | ORAL_TABLET | Freq: Three times a day (TID) | ORAL | Status: DC

## 2015-03-02 NOTE — ED Notes (Signed)
Patient presents stating after receiving her Depo on Sept 6th she has noticed frequent urination.  Denies pain or blood, admits to vag discharge (yellow in color)

## 2015-03-02 NOTE — Discharge Instructions (Signed)
Cipro as prescribed.  Pyridium as prescribed as needed for urinary frequency.  Return to the emergency department if symptoms significantly worsen or change.   Urinary Tract Infection Urinary tract infections (UTIs) can develop anywhere along your urinary tract. Your urinary tract is your body's drainage system for removing wastes and extra water. Your urinary tract includes two kidneys, two ureters, a bladder, and a urethra. Your kidneys are a pair of bean-shaped organs. Each kidney is about the size of your fist. They are located below your ribs, one on each side of your spine. CAUSES Infections are caused by microbes, which are microscopic organisms, including fungi, viruses, and bacteria. These organisms are so small that they can only be seen through a microscope. Bacteria are the microbes that most commonly cause UTIs. SYMPTOMS  Symptoms of UTIs may vary by age and gender of the patient and by the location of the infection. Symptoms in young women typically include a frequent and intense urge to urinate and a painful, burning feeling in the bladder or urethra during urination. Older women and men are more likely to be tired, shaky, and weak and have muscle aches and abdominal pain. A fever may mean the infection is in your kidneys. Other symptoms of a kidney infection include pain in your back or sides below the ribs, nausea, and vomiting. DIAGNOSIS To diagnose a UTI, your caregiver will ask you about your symptoms. Your caregiver also will ask to provide a urine sample. The urine sample will be tested for bacteria and white blood cells. White blood cells are made by your body to help fight infection. TREATMENT  Typically, UTIs can be treated with medication. Because most UTIs are caused by a bacterial infection, they usually can be treated with the use of antibiotics. The choice of antibiotic and length of treatment depend on your symptoms and the type of bacteria causing your infection. HOME  CARE INSTRUCTIONS  If you were prescribed antibiotics, take them exactly as your caregiver instructs you. Finish the medication even if you feel better after you have only taken some of the medication.  Drink enough water and fluids to keep your urine clear or pale yellow.  Avoid caffeine, tea, and carbonated beverages. They tend to irritate your bladder.  Empty your bladder often. Avoid holding urine for long periods of time.  Empty your bladder before and after sexual intercourse.  After a bowel movement, women should cleanse from front to back. Use each tissue only once. SEEK MEDICAL CARE IF:   You have back pain.  You develop a fever.  Your symptoms do not begin to resolve within 3 days. SEEK IMMEDIATE MEDICAL CARE IF:   You have severe back pain or lower abdominal pain.  You develop chills.  You have nausea or vomiting.  You have continued burning or discomfort with urination. MAKE SURE YOU:   Understand these instructions.  Will watch your condition.  Will get help right away if you are not doing well or get worse. Document Released: 03/12/2005 Document Revised: 12/02/2011 Document Reviewed: 07/11/2011 [facility] Patient Information 2015 [place], [place]. This information is not intended to replace advice given to you by your health care provider. Make sure you discuss any questions you have with your health care provider.

## 2015-03-02 NOTE — ED Provider Notes (Signed)
CSN: [id number]     Arrival date & time 03/02/15  1927 History   First MD Initiated Contact with Patient 03/02/15 2251     Chief Complaint  Patient presents with  . Urinary Frequency     (Consider location/radiation/quality/duration/timing/severity/associated sxs/prior Treatment) HPI Comments: Patient is a 29 year old female with no significant past medical history. She presents for evaluation of urinary frequency that has worsened over the past several days. She denies any burning with urination, hematuria, fever, abdominal pain, or flank pain. She does admit to a slight, oranges vaginal discharge that she attributes to the Azo she is taking over-the-counter. She is sexually active the same partner for the past 5 years. She recently received a Depo injection.  Patient is a 29 y.o. female presenting with frequency. The history is provided by the patient.  Urinary Frequency This is a new problem. The current episode started 2 days ago. The problem occurs constantly. The problem has been gradually worsening. Pertinent negatives include no chest pain and no abdominal pain. Nothing aggravates the symptoms. Nothing relieves the symptoms. She has tried nothing for the symptoms. The treatment provided no relief.    Past Medical History  Diagnosis Date  . Vaginal Pap smear, abnormal    Past Surgical History  Procedure Laterality Date  . Therapeutic abortion    . Colposcopy    . Wisdom tooth extraction     No family history on file. Social History  Substance Use Topics  . Smoking status: Never Smoker   . Smokeless tobacco: Never Used  . Alcohol Use: Yes     Comment: occasionally   OB History    Gravida Para Term Preterm AB TAB SAB Ectopic Multiple Living   4 1 1  3 1 1   1      Review of Systems  Cardiovascular: Negative for chest pain.  Gastrointestinal: Negative for abdominal pain.  Genitourinary: Positive for frequency.  All other systems reviewed and are  negative.     Allergies  Review of patient's allergies indicates no known allergies.  Home Medications   Prior to Admission medications   Medication Sig Start Date End Date Taking? Authorizing Provider  acetaminophen (TYLENOL) 325 MG tablet Take 650 mg by mouth daily as needed for pain.    Historical Provider, MD  diphenhydrAMINE (BENADRYL) 25 MG tablet Take 2 tablets (50 mg total) by mouth every 4 (four) hours as needed for itching. 08/14/14   Comer Locket, PA-C  hydrocortisone cream 1 % Apply 1 application topically 2 (two) times daily. 08/14/14   Comer Locket, PA-C  norethindrone-ethinyl estradiol-iron (MICROGESTIN FE,GILDESS FE,LOESTRIN FE) 1.5-30 MG-MCG tablet Take 1 tablet by mouth daily.    Historical Provider, MD  ranitidine (ZANTAC) 150 MG tablet Take 1 tablet (150 mg total) by mouth 2 (two) times daily. 08/14/14   Comer Locket, PA-C   BP 115/78 mmHg  Pulse 100  Temp(Src) 99.3 F (37.4 C) (Oral)  Resp 18  SpO2 100%  LMP 02/06/2015 Physical Exam  Constitutional: She is oriented to person, place, and time. She appears well-developed and well-nourished. No distress.  HENT:  Head: Normocephalic and atraumatic.  Neck: Normal range of motion. Neck supple.  Cardiovascular: Normal rate and regular rhythm.  Exam reveals no gallop and no friction rub.   No murmur heard. Pulmonary/Chest: Effort normal and breath sounds normal. No respiratory distress. She has no wheezes.  Abdominal: Soft. Bowel sounds are normal. She exhibits no distension. There is no tenderness.  Musculoskeletal: Normal range of  motion.  Neurological: She is alert and oriented to person, place, and time.  Skin: Skin is warm and dry. She is not diaphoretic.  Nursing note and vitals reviewed.   ED Course  Procedures (including critical care time) Labs Review Labs Reviewed  URINALYSIS, ROUTINE W REFLEX MICROSCOPIC (NOT AT [facility]) - Abnormal; Notable for the following:    APPearance CLOUDY (*)     Leukocytes, UA MODERATE (*)    All other components within normal limits  URINE MICROSCOPIC-ADD ON - Abnormal; Notable for the following:    Squamous Epithelial / LPF MANY (*)    Bacteria, UA FEW (*)    Casts HYALINE CASTS (*)    All other components within normal limits  POC URINE PREG, ED    Imaging Review No results found. I have personally reviewed and evaluated these images and lab results as part of my medical decision-making.   EKG Interpretation None      MDM   Final diagnoses:  None    UA is consistent with a urinary tract infection. This will be treated with Cipro and when necessary return. Patient is reporting a slight vaginal discharge. A pelvic examination was recommended and offered, however the patient is refusing this at this time. She states if she does not improve with antibiotics, she will either return or follow-up with her primary Dr. for further evaluation of this.    Veryl Speak, MD 03/02/15 [phone]

## 2015-11-13 ENCOUNTER — Emergency Department (HOSPITAL_COMMUNITY)
Admission: EM | Admit: 2015-11-13 | Discharge: 2015-11-14 | Disposition: A | Discharge status: Home / Self Care | Payer: Self-pay | Attending: Emergency Medicine | Admitting: Emergency Medicine

## 2015-11-13 ENCOUNTER — Encounter (HOSPITAL_COMMUNITY): Payer: Self-pay | Admitting: *Deleted

## 2015-11-13 ENCOUNTER — Emergency Department (HOSPITAL_COMMUNITY): Payer: Self-pay

## 2015-11-13 DIAGNOSIS — Z79899 Other long term (current) drug therapy: Secondary | ICD-10-CM | POA: Insufficient documentation

## 2015-11-13 DIAGNOSIS — R05 Cough: Secondary | ICD-10-CM | POA: Insufficient documentation

## 2015-11-13 DIAGNOSIS — R0981 Nasal congestion: Secondary | ICD-10-CM | POA: Insufficient documentation

## 2015-11-13 DIAGNOSIS — R590 Localized enlarged lymph nodes: Secondary | ICD-10-CM | POA: Insufficient documentation

## 2015-11-13 LAB — CBC
HCT: 39.4 % (ref 36.0–46.0)
Hemoglobin: 13.5 g/dL (ref 12.0–15.0)
MCH: 30.9 pg (ref 26.0–34.0)
MCHC: 34.3 g/dL (ref 30.0–36.0)
MCV: 90.2 fL (ref 78.0–100.0)
Platelets: 301 10*3/uL (ref 150–400)
RBC: 4.37 MIL/uL (ref 3.87–5.11)
RDW: 12.1 % (ref 11.5–15.5)
WBC: 7.9 10*3/uL (ref 4.0–10.5)

## 2015-11-13 LAB — BASIC METABOLIC PANEL
Anion gap: 8 (ref 5–15)
BUN: 8 mg/dL (ref 6–20)
CO2: 23 mmol/L (ref 22–32)
Calcium: 9.7 mg/dL (ref 8.9–10.3)
Chloride: 107 mmol/L (ref 101–111)
Creatinine, Ser: 0.75 mg/dL (ref 0.44–1.00)
GFR calc Af Amer: >60 mL/min (ref >60)
GFR calc non Af Amer: >60 mL/min (ref >60)
Glucose, Bld: 122 mg/dL — ABNORMAL HIGH (ref 65–99)
Potassium: 2.9 mmol/L — ABNORMAL LOW (ref 3.5–5.1)
Sodium: 138 mmol/L (ref 135–145)

## 2015-11-13 LAB — MAGNESIUM: Magnesium: 2 mg/dL (ref 1.7–2.4)

## 2015-11-13 LAB — MONONUCLEOSIS SCREEN: Mono Screen: NEGATIVE

## 2015-11-13 LAB — RAPID STREP SCREEN (MED CTR MEBANE ONLY): Streptococcus, Group A Screen (Direct): NEGATIVE

## 2015-11-13 MED ORDER — POTASSIUM CHLORIDE CRYS ER 20 MEQ PO TBCR
40.0000 meq | EXTENDED_RELEASE_TABLET | Freq: Once | ORAL | Status: AC
  Administered 2015-11-13: 40 meq via ORAL
  Filled 2015-11-13: qty 2

## 2015-11-13 NOTE — ED Provider Notes (Signed)
CSN: [id number]     Arrival date & time 11/13/15  1741 History   First MD Initiated Contact with Patient 11/13/15 2143     No chief complaint on file.   (Consider location/radiation/quality/duration/timing/severity/associated sxs/prior Treatment) The history is provided by the patient and medical records. No language interpreter was used.   Chelsey Cook is a 30 y.o. female  with no pertinent PMH who presents to the Emergency Department complaining of swollen non-tender posterior lymph nodes which she noticed two days ago. Associated symptoms include nasal congestion and productive cough which are improving. Benadryl and zantac taken for symptoms with some relief. No meds taken today for symptoms. Patient states she googled her symptoms and came in to the ED today for concern of malignancy. She states she has been very anxious all day about her swollen lymph nodes and can't seem to quit thinking about what could be happening. No fever, chest pain, sob, abdominal pain, n/v.   Past Medical History  Diagnosis Date  . Vaginal Pap smear, abnormal    Past Surgical History  Procedure Laterality Date  . Therapeutic abortion    . Colposcopy    . Wisdom tooth extraction     No family history on file. Social History  Substance Use Topics  . Smoking status: Never Smoker   . Smokeless tobacco: Never Used  . Alcohol Use: Yes     Comment: occasionally   OB History    Gravida Para Term Preterm AB TAB SAB Ectopic Multiple Living   4 1 1  3 1 1   1      Review of Systems  Constitutional: Negative for fever and chills.  HENT: Positive for congestion. Negative for sore throat.   Eyes: Negative for visual disturbance.  Respiratory: Positive for cough. Negative for shortness of breath.   Cardiovascular: Negative.   Gastrointestinal: Negative for nausea, vomiting and abdominal pain.  Genitourinary: Negative for dysuria.  Musculoskeletal: Negative for back pain and neck pain.  Skin: Negative  for rash.  Neurological: Negative for dizziness, weakness and headaches.      Allergies  Review of patient's allergies indicates no known allergies.  Home Medications   Prior to Admission medications   Medication Sig Start Date End Date Taking? Authorizing Provider  acetaminophen (TYLENOL) 325 MG tablet Take 650 mg by mouth daily as needed for mild pain.    Yes Historical Provider, MD  diphenhydrAMINE (BENADRYL) 25 MG tablet Take 2 tablets (50 mg total) by mouth every 4 (four) hours as needed for itching. 08/14/14  Yes Comer Locket, PA-C  medroxyPROGESTERone (DEPO-PROVERA) 150 MG/ML injection Inject 150 mg into the muscle every 3 (three) months.   Yes Historical Provider, MD  Multiple Vitamin (MULTIVITAMIN) LIQD Take 5 mLs by mouth daily.   Yes Historical Provider, MD  naproxen (NAPROSYN) 500 MG tablet Take 1 tablet (500 mg total) by mouth 2 (two) times daily. 11/14/15   Jaime Pilcher Ward, PA-C   BP 124/79 mmHg  Pulse 110  Temp(Src) 99.1 F (37.3 C) (Oral)  Resp 20  Ht 5\' 3"  (1.6 m)  Wt 64.864 kg  BMI 25.34 kg/m2  SpO2 98%  LMP 06/13/2015 Physical Exam  Constitutional: She is oriented to person, place, and time. She appears well-developed and well-nourished.  Alert, anxious, NAD.   HENT:  Head: Normocephalic and atraumatic.  OP with erythema, no exudates or tonsillar hypertrophy. + nasal congestion. No focal sinus tenderness.   Neck: Normal range of motion. Neck supple.  Bilateral 1.5  cm non-tender, non-erythematous posterior cervical adenopathy.   Cardiovascular: Normal heart sounds and intact distal pulses.  Exam reveals no gallop and no friction rub.   No murmur heard. Tachycardic but regular.   Pulmonary/Chest: Effort normal and breath sounds normal. No respiratory distress. She has no wheezes. She has no rales. She exhibits no tenderness.  Abdominal: Soft. Bowel sounds are normal. She exhibits no distension. There is no tenderness.  Musculoskeletal: Normal range of  motion.  Neurological: She is alert and oriented to person, place, and time.  Skin: Skin is warm and dry. No rash noted.  Nursing note and vitals reviewed.   ED Course  Procedures (including critical care time) Labs Review Labs Reviewed  BASIC METABOLIC PANEL - Abnormal; Notable for the following:    Potassium 2.9 (*)    Glucose, Bld 122 (*)    All other components within normal limits  RAPID STREP SCREEN (NOT AT [facility])  CULTURE, GROUP A STREP [facility])  CBC  MONONUCLEOSIS SCREEN  MAGNESIUM    Imaging Review Dg Chest 2 View  11/13/2015  CLINICAL DATA:  Cough for 1 week with chest congestion. One day history of tachycardia. EXAM: CHEST  2 VIEW COMPARISON:  September 28, 2005 FINDINGS: Lungs are clear. Heart size and pulmonary vascularity are normal. No adenopathy. No bone lesions. IMPRESSION: No edema or consolidation. Electronically Signed   By: Lowella Grip III M.D.   On: 11/13/2015 19:20   I have personally reviewed and evaluated these images and lab results as part of my medical decision-making.   EKG Interpretation   Date/Time:  Tuesday Nov 13 2015 17:54:14 EDT Ventricular Rate:  146 PR Interval:  112 QRS Duration: 74 QT Interval:  346 QTC Calculation: 539 R Axis:   83 Text Interpretation:  Sinus tachycardia Otherwise normal ECG Confirmed by  Hazle Coca [id number]) on 11/13/2015 7:19:28 PM      MDM   Final diagnoses:  Posterior cervical lymphadenopathy   Chelsey Cook presents to ED for non-tender posterior cervical adenopathy that she noticed 2 days ago. Associated symptoms are improving cough, congestion. Patient is very anxious and worried that adenopathy is cancerous. HR 130's-140's during initial examination.   CXR reassuring. CBC wdl, BMP with K+ of 2.9 - replenished in ED. Mag nl. EKG reviewed.  Strep and mono negative.  HR improved to 90's-110's.   Plan: Reassurance, naproxen, PCP follow up in 1 week if no improvement in symptoms. Return precautions given  and all questions answered.   Patient discussed with Dr. Venora Maples who agrees with treatment plan.   [facility] Ward, PA-C 11/14/15 0021  Jola Schmidt, MD 11/14/15 [phone]

## 2015-11-13 NOTE — ED Notes (Signed)
Pt c/o swollen lymph nodes in the neck & nasal congestion, c/o productive cough yellow sputum, denies ear pain & sore throat, A&O x4

## 2015-11-14 MED ORDER — NAPROXEN 500 MG PO TABS: 500.0000 mg | ORAL_TABLET | Freq: Two times a day (BID) | ORAL | Status: DC

## 2015-11-14 NOTE — Discharge Instructions (Signed)
1. Medications: Take naproxen as directed 2. Treatment: rest, drink plenty of fluids 3. Follow Up: Please follow up with your primary doctor in 7 days for discussion of your diagnoses and further evaluation after today's visit; Please return to the ER for chest pain, high fevers, new or worsening symptoms, any additional concerns.   If you do not have a primary physician, call the number listed on your insurance card for helping with this. You can also call the primary care clinic listed to schedule an appointment.

## 2015-11-15 LAB — CULTURE, GROUP A STREP (THRC)

## 2016-02-07 ENCOUNTER — Encounter (HOSPITAL_COMMUNITY): Payer: Self-pay | Admitting: Physician Assistant

## 2019-06-17 NOTE — L&D Delivery Note (Signed)
Delivery Note Chelsey Cook is a 34 y.o. [id number] at [redacted]w[redacted]d admitted for IOL d/t GHTN.   GBS Status:  Positive/-- (06/20 0000) Maximum Maternal Temperature: 98.9  Labor course: Initial SVE: 3/40/ballotable. Augmentation with: AROM, Pitocin, Cytotec and OP Foley. She then progressed to complete.  ROM: 2h 21m with clear fluid  Birth: At 0620 a viable female was delivered via spontaneous vaginal delivery (Presentation: LOA). Nuchal cord present: Yes x 3, tight, reduced immediately after birth  Shoulders and body delivered in usual fashion. Infant placed directly on mom's abdomen for bonding/skin-to-skin, baby dried and stimulated. Cord clamped x 2 after 1 minute and cut by [name].  Cord blood collected.  The placenta separated spontaneously and delivered via gentle cord traction.  Pitocin infused rapidly IV per protocol.  Fundus firm with massage.  Placenta inspected and appears to be intact with a 3 VC.  Placenta/Cord with the following complications: near marginal insertion .  Cord pH: n/a Sponge and instrument count were correct x2.  Intrapartum complications:  None Anesthesia:  epidural Episiotomy: none Lacerations:  none Suture Repair: n/a EBL (mL): 150   Infant: APGAR (1 MIN): 9   APGAR (5 MINS): 9   APGAR (10 MINS):    Infant weight: pending  Mom to postpartum.  Baby to Couplet care / Skin to Skin. Placenta to L&D   Plans to Breastfeed Contraception: tubal ligation Circumcision: wants inpatient  Note sent to [facility]: [facility] for pp visit.  [place], [facility] 05/02/2020 6:38 AM

## 2019-06-20 ENCOUNTER — Encounter (HOSPITAL_COMMUNITY): Payer: Self-pay | Admitting: Emergency Medicine

## 2019-06-20 ENCOUNTER — Other Ambulatory Visit: Payer: Self-pay

## 2019-06-20 ENCOUNTER — Emergency Department (HOSPITAL_COMMUNITY)
Admission: EM | Admit: 2019-06-20 | Discharge: 2019-06-21 | Discharge status: Against Medical Advice | Payer: Commercial Managed Care - PPO | Attending: Emergency Medicine | Admitting: Emergency Medicine

## 2019-06-20 DIAGNOSIS — Z5321 Procedure and treatment not carried out due to patient leaving prior to being seen by health care provider: Secondary | ICD-10-CM | POA: Insufficient documentation

## 2019-06-20 NOTE — ED Triage Notes (Signed)
Pt c/o intermittent vaginal bleeding x 3 weeks. LMP Dec. 11. Denies abdominal pain or abnormal vaginal discharge.

## 2019-06-20 NOTE — ED Notes (Signed)
Pt  decided  to  leave  due to wait  time

## 2019-06-21 NOTE — ED Notes (Signed)
Pt is not on the floor.

## 2019-10-06 ENCOUNTER — Inpatient Hospital Stay (HOSPITAL_COMMUNITY): Payer: Medicaid Other

## 2019-10-06 ENCOUNTER — Inpatient Hospital Stay (HOSPITAL_COMMUNITY)
Admission: AD | Admit: 2019-10-06 | Discharge: 2019-10-06 | Disposition: A | Discharge status: Home / Self Care | Payer: Medicaid Other | Attending: Family Medicine | Admitting: Family Medicine

## 2019-10-06 ENCOUNTER — Other Ambulatory Visit: Payer: Self-pay

## 2019-10-06 ENCOUNTER — Encounter (HOSPITAL_COMMUNITY): Payer: Self-pay | Admitting: Family Medicine

## 2019-10-06 DIAGNOSIS — D259 Leiomyoma of uterus, unspecified: Secondary | ICD-10-CM

## 2019-10-06 DIAGNOSIS — Z3491 Encounter for supervision of normal pregnancy, unspecified, first trimester: Secondary | ICD-10-CM

## 2019-10-06 DIAGNOSIS — Z3A01 Less than 8 weeks gestation of pregnancy: Secondary | ICD-10-CM | POA: Diagnosis not present

## 2019-10-06 DIAGNOSIS — O26891 Other specified pregnancy related conditions, first trimester: Secondary | ICD-10-CM

## 2019-10-06 DIAGNOSIS — R1031 Right lower quadrant pain: Secondary | ICD-10-CM | POA: Diagnosis present

## 2019-10-06 DIAGNOSIS — D219 Benign neoplasm of connective and other soft tissue, unspecified: Secondary | ICD-10-CM

## 2019-10-06 DIAGNOSIS — R109 Unspecified abdominal pain: Secondary | ICD-10-CM

## 2019-10-06 DIAGNOSIS — O3411 Maternal care for benign tumor of corpus uteri, first trimester: Secondary | ICD-10-CM | POA: Diagnosis not present

## 2019-10-06 LAB — CBC
HCT: 40.6 % (ref 36.0–46.0)
Hemoglobin: 14 g/dL (ref 12.0–15.0)
MCH: 32.2 pg (ref 26.0–34.0)
MCHC: 34.5 g/dL (ref 30.0–36.0)
MCV: 93.3 fL (ref 80.0–100.0)
Platelets: 339 10*3/uL (ref 150–400)
RBC: 4.35 MIL/uL (ref 3.87–5.11)
RDW: 11.8 % (ref 11.5–15.5)
WBC: 7.8 10*3/uL (ref 4.0–10.5)
nRBC: 0 % (ref 0.0–0.2)

## 2019-10-06 LAB — HCG, QUANTITATIVE, PREGNANCY: hCG, Beta Chain, Quant, S: 78466 m[IU]/mL — ABNORMAL HIGH (ref <5)

## 2019-10-06 LAB — COMPREHENSIVE METABOLIC PANEL
ALT: 21 U/L (ref 0–44)
AST: 22 U/L (ref 15–41)
Albumin: 4.2 g/dL (ref 3.5–5.0)
Alkaline Phosphatase: 33 U/L — ABNORMAL LOW (ref 38–126)
Anion gap: 11 (ref 5–15)
BUN: 11 mg/dL (ref 6–20)
CO2: 22 mmol/L (ref 22–32)
Calcium: 9.5 mg/dL (ref 8.9–10.3)
Chloride: 103 mmol/L (ref 98–111)
Creatinine, Ser: 0.7 mg/dL (ref 0.44–1.00)
GFR calc Af Amer: >60 mL/min (ref >60)
GFR calc non Af Amer: >60 mL/min (ref >60)
Glucose, Bld: 106 mg/dL — ABNORMAL HIGH (ref 70–99)
Potassium: 3.8 mmol/L (ref 3.5–5.1)
Sodium: 136 mmol/L (ref 135–145)
Total Bilirubin: 0.7 mg/dL (ref 0.3–1.2)
Total Protein: 7.7 g/dL (ref 6.5–8.1)

## 2019-10-06 LAB — WET PREP, GENITAL
Sperm: NONE SEEN
Trich, Wet Prep: NONE SEEN
Yeast Wet Prep HPF POC: NONE SEEN

## 2019-10-06 LAB — URINALYSIS, ROUTINE W REFLEX MICROSCOPIC
Bilirubin Urine: NEGATIVE
Glucose, UA: NEGATIVE mg/dL
Hgb urine dipstick: NEGATIVE
Ketones, ur: NEGATIVE mg/dL
Nitrite: NEGATIVE
Protein, ur: NEGATIVE mg/dL
Specific Gravity, Urine: 1.027 (ref 1.005–1.030)
pH: 5 (ref 5.0–8.0)

## 2019-10-06 LAB — POCT PREGNANCY, URINE: Preg Test, Ur: POSITIVE — AB

## 2019-10-06 NOTE — Discharge Instructions (Signed)
[facility] Providers    [facility]       Phone: [phone]  [facility]   Phone: [phone]  [facility]   Phone: [phone]  [facility] [place]  Phone: [phone]  [facility] [place]  Phone: [phone]  [facility]   Phone: [phone]  [facility]       Phone: [phone]  [facility]    Phone: [phone]   [facility]    Phone: [phone]  [facility]    Phone: [phone]  [facility]    Phone: [phone]  [facility]       Phone: [phone]   [facility]  Phone: [phone]  [facility]    Phone: [phone]  [facility]   Phone: [phone]  [facility]      Phone: [phone]  [facility]    Phone: [phone]   

## 2019-10-06 NOTE — MAU Provider Note (Addendum)
Patient Chelsey Cook is a 34 y.o. [phone] at [redacted]w[redacted]d gestation  here with a complaint of right lower quadrant pain that started two nights ago. She denies vaginal discharge, vaginal bleeding, fever, SOB, dysuria, pain with urination. She reports some constipation and some nausea.  The pain feels like a "pulling sensation".   History     CSN: [id number]  Arrival date and time: 10/06/19 0959   None     Chief Complaint  Patient presents with  . Abdominal Pain   Abdominal Pain This is a new problem. The current episode started in the past 7 days. The problem occurs intermittently. The problem has been unchanged. The pain is located in the LLQ. The pain is at a severity of 2/10. Quality: "pulling, not cramping" The abdominal pain does not radiate. Associated symptoms include constipation and nausea. Pertinent negatives include no dysuria or vomiting.  It only comes on at night; she does not have it right now. She wants to know what she can take for the pain. She has not tried anything for it.   OB History    Gravida  5   Para  1   Term  1   Preterm      AB  3   Living  1     SAB  1   TAB  1   Ectopic      Multiple      Live Births  1           Past Medical History:  Diagnosis Date  . Vaginal Pap smear, abnormal     Past Surgical History:  Procedure Laterality Date  . COLPOSCOPY    . THERAPEUTIC ABORTION    . WISDOM TOOTH EXTRACTION      History reviewed. No pertinent family history.  Social History   Tobacco Use  . Smoking status: Never Smoker  . Smokeless tobacco: Never Used  Substance Use Topics  . Alcohol use: Yes    Comment: occasionally  . Drug use: No    Allergies:  Allergies  Allergen Reactions  . Naproxen Anaphylaxis    Medications Prior to Admission  Medication Sig Dispense Refill Last Dose  . Multiple Vitamin (MULTIVITAMIN) LIQD Take 5 mLs by mouth daily.   10/05/2019 at Unknown time  . acetaminophen (TYLENOL) 325 MG tablet Take  650 mg by mouth daily as needed for mild pain.    More than a month at Unknown time  . diphenhydrAMINE (BENADRYL) 25 MG tablet Take 2 tablets (50 mg total) by mouth every 4 (four) hours as needed for itching. 20 tablet 0 More than a month at Unknown time  . medroxyPROGESTERone (DEPO-PROVERA) 150 MG/ML injection Inject 150 mg into the muscle every 3 (three) months.   More than a month at Unknown time  . naproxen (NAPROSYN) 500 MG tablet Take 1 tablet (500 mg total) by mouth 2 (two) times daily. 30 tablet 0 More than a month at Unknown time    Review of Systems  HENT: Negative.   Respiratory: Negative.   Gastrointestinal: Positive for abdominal pain, constipation and nausea. Negative for vomiting.  Genitourinary: Negative for dysuria, vaginal bleeding, vaginal discharge and vaginal pain.  Neurological: Negative.   Psychiatric/Behavioral: Negative.    Physical Exam   Blood pressure 126/74, pulse (!) 110, temperature 98.6 F (37 C), resp. rate 16, last menstrual period 08/16/2019, unknown if currently breastfeeding.  Physical Exam  Constitutional: She is oriented to person, place, and time. She appears well-developed.  HENT:  Head: Normocephalic.  Respiratory: Effort normal.  GI: Soft.  Genitourinary:    Vagina normal.     Genitourinary Comments: NEFG; cervix is closed, long, thick, no CMT, suprapubic or adnexal tenderness.    Musculoskeletal:     Cervical back: Normal range of motion.  Neurological: She is alert and oriented to person, place, and time.  Skin: Skin is warm and dry.    MAU Course  Procedures  MDM -ectopic rule out performed -beta hcg, CBC, CMP, [place] and vaginal cultures ordered.  -US shows viable IUP with cardiac activity. Beta over 78,466. Assessment and Plan   1. Viable pregnancy in first trimester   2. Abdominal pain   3. 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 Counseled patient that pain is most likely due to muscular stretching/normal changes in pregnancy.  -Return to MAU if any changes in pain or symptoms.     Mervyn Skeeters Letesha Klecker 10/06/2019, 10:57 AM

## 2019-10-06 NOTE — MAU Note (Signed)
.   Chelsey Cook is a 34 y.o. at [redacted]w[redacted]d here in MAU reporting: pain in the right side of her lower abdomen. States it feels like a tugging. Denies any VB LMP: 08/16/19 Onset of complaint: yesterday Pain score: 2 Vitals:   10/06/19 1021  BP: 126/74  Pulse: (!) 110  Resp: 16  Temp: 98.6 F (37 C)     FHT: Lab orders placed from triage: UA/UPT

## 2019-10-07 LAB — GC/CHLAMYDIA PROBE AMP (~~LOC~~) NOT AT ARMC
Chlamydia: NEGATIVE
Comment: NEGATIVE
Comment: NORMAL
Neisseria Gonorrhea: NEGATIVE

## 2019-11-03 ENCOUNTER — Ambulatory Visit (INDEPENDENT_AMBULATORY_CARE_PROVIDER_SITE_OTHER): Payer: Medicaid Other

## 2019-11-03 DIAGNOSIS — Z3480 Encounter for supervision of other normal pregnancy, unspecified trimester: Secondary | ICD-10-CM

## 2019-11-03 MED ORDER — BLOOD PRESSURE KIT DEVI: 1.0000 | 0 refills | Status: DC

## 2019-11-03 NOTE — Progress Notes (Addendum)
PRENATAL INTAKE SUMMARY  Chelsey Cook presents today New OB Nurse Interview.  OB History    Gravida  5   Para  1   Term  1   Preterm      AB  3   Living  1     SAB  1   TAB  1   Ectopic      Multiple      Live Births  1          I have reviewed the patient's medical, obstetrical, social, and family histories, medications, and available lab results.  SUBJECTIVE She has no unusual complaints  OBJECTIVE Initial Physical Exam (New OB)  GENERAL APPEARANCE: oriented to person, place and time   ASSESSMENT Normal pregnancy  PLAN Prenatal care at [facility] BP CUFF ordered, patient will pick up and to NOB Appt. PHQ-9=1 Pregnancy risk screening done   NOB Labs will be done at NOB visit

## 2019-11-10 ENCOUNTER — Encounter: Payer: Self-pay | Admitting: Obstetrics and Gynecology

## 2019-11-10 ENCOUNTER — Ambulatory Visit (INDEPENDENT_AMBULATORY_CARE_PROVIDER_SITE_OTHER): Payer: Medicaid Other | Admitting: Obstetrics and Gynecology

## 2019-11-10 ENCOUNTER — Other Ambulatory Visit: Payer: Self-pay

## 2019-11-10 ENCOUNTER — Other Ambulatory Visit (HOSPITAL_COMMUNITY)
Admission: RE | Admit: 2019-11-10 | Discharge: 2019-11-10 | Disposition: A | Discharge status: Home / Self Care | Payer: Medicaid Other | Source: Ambulatory Visit | Attending: Obstetrics and Gynecology | Admitting: Obstetrics and Gynecology

## 2019-11-10 DIAGNOSIS — Z3A12 12 weeks gestation of pregnancy: Secondary | ICD-10-CM

## 2019-11-10 DIAGNOSIS — Z3480 Encounter for supervision of other normal pregnancy, unspecified trimester: Secondary | ICD-10-CM | POA: Insufficient documentation

## 2019-11-10 DIAGNOSIS — Z3481 Encounter for supervision of other normal pregnancy, first trimester: Secondary | ICD-10-CM | POA: Diagnosis not present

## 2019-11-10 NOTE — Patient Instructions (Signed)
First Trimester of Pregnancy The first trimester of pregnancy is from week 1 until the end of week 13 (months 1 through 3). A week after a sperm fertilizes an egg, the egg will implant on the wall of the uterus. This embryo will begin to develop into a baby. Genes from you and your partner will form the baby. The female genes will determine whether the baby will be a boy or a girl. At 6-8 weeks, the eyes and face will be formed, and the heartbeat can be seen on ultrasound. At the end of 12 weeks, all the baby's organs will be formed. Now that you are pregnant, you will want to do everything you can to have a healthy baby. Two of the most important things are to get good prenatal care and to follow your health care provider's instructions. Prenatal care is all the medical care you receive before the baby's birth. This care will help prevent, find, and treat any problems during the pregnancy and childbirth. Body changes during your first trimester Your body goes through many changes during pregnancy. The changes vary from woman to woman.  You may gain or lose a couple of pounds at first.  You may feel sick to your stomach (nauseous) and you may throw up (vomit). If the vomiting is uncontrollable, call your health care provider.  You may tire easily.  You may develop headaches that can be relieved by medicines. All medicines should be approved by your health care provider.  You may urinate more often. Painful urination may mean you have a bladder infection.  You may develop heartburn as a result of your pregnancy.  You may develop constipation because certain hormones are causing the muscles that push stool through your intestines to slow down.  You may develop hemorrhoids or swollen veins (varicose veins).  Your breasts may begin to grow larger and become tender. Your nipples may stick out more, and the tissue that surrounds them (areola) may become darker.  Your gums may bleed and may be  sensitive to brushing and flossing.  Dark spots or blotches (chloasma, mask of pregnancy) may develop on your face. This will likely fade after the baby is born.  Your menstrual periods will stop.  You may have a loss of appetite.  You may develop cravings for certain kinds of food.  You may have changes in your emotions from day to day, such as being excited to be pregnant or being concerned that something may go wrong with the pregnancy and baby.  You may have more vivid and strange dreams.  You may have changes in your hair. These can include thickening of your hair, rapid growth, and changes in texture. Some women also have hair loss during or after pregnancy, or hair that feels dry or thin. Your hair will most likely return to normal after your baby is born. What to expect at prenatal visits During a routine prenatal visit:  You will be weighed to make sure you and the baby are growing normally.  Your blood pressure will be taken.  Your abdomen will be measured to track your baby's growth.  The fetal heartbeat will be listened to between weeks 10 and 14 of your pregnancy.  Test results from any previous visits will be discussed. Your health care provider may ask you:  How you are feeling.  If you are feeling the baby move.  If you have had any abnormal symptoms, such as leaking fluid, bleeding, severe headaches, or abdominal   cramping.  If you are using any tobacco products, including cigarettes, chewing tobacco, and electronic cigarettes.  If you have any questions. Other tests that may be performed during your first trimester include:  Blood tests to find your blood type and to check for the presence of any previous infections. The tests will also be used to check for low iron levels (anemia) and protein on red blood cells (Rh antibodies). Depending on your risk factors, or if you previously had diabetes during pregnancy, you may have tests to check for high blood sugar  that affects pregnant women (gestational diabetes).  Urine tests to check for infections, diabetes, or protein in the urine.  An ultrasound to confirm the proper growth and development of the baby.  Fetal screens for spinal cord problems (spina bifida) and Down syndrome.  HIV (human immunodeficiency virus) testing. Routine prenatal testing includes screening for HIV, unless you choose not to have this test.  You may need other tests to make sure you and the baby are doing well. Follow these instructions at home: Medicines  Follow your health care provider's instructions regarding medicine use. Specific medicines may be either safe or unsafe to take during pregnancy.  Take a prenatal vitamin that contains at least 600 micrograms (mcg) of folic acid.  If you develop constipation, try taking a stool softener if your health care provider approves. Eating and drinking   Eat a balanced diet that includes fresh fruits and vegetables, whole grains, good sources of protein such as meat, eggs, or tofu, and low-fat dairy. Your health care provider will help you determine the amount of weight gain that is right for you.  Avoid raw meat and uncooked cheese. These carry germs that can cause birth defects in the baby.  Eating four or five small meals rather than three large meals a day may help relieve nausea and vomiting. If you start to feel nauseous, eating a few soda crackers can be helpful. Drinking liquids between meals, instead of during meals, also seems to help ease nausea and vomiting.  Limit foods that are high in fat and processed sugars, such as fried and sweet foods.  To prevent constipation: ? Eat foods that are high in fiber, such as fresh fruits and vegetables, whole grains, and beans. ? Drink enough fluid to keep your urine clear or pale yellow. Activity  Exercise only as directed by your health care provider. Most women can continue their usual exercise routine during  pregnancy. Try to exercise for 30 minutes at least 5 days a week. Exercising will help you: ? Control your weight. ? Stay in shape. ? Be prepared for labor and delivery.  Experiencing pain or cramping in the lower abdomen or lower back is a good sign that you should stop exercising. Check with your health care provider before continuing with normal exercises.  Try to avoid standing for long periods of time. Move your legs often if you must stand in one place for a long time.  Avoid heavy lifting.  Wear low-heeled shoes and practice good posture.  You may continue to have sex unless your health care provider tells you not to. Relieving pain and discomfort  Wear a good support bra to relieve breast tenderness.  Take warm sitz baths to soothe any pain or discomfort caused by hemorrhoids. Use hemorrhoid cream if your health care provider approves.  Rest with your legs elevated if you have leg cramps or low back pain.  If you develop varicose veins in   your legs, wear support hose. Elevate your feet for 15 minutes, 3-4 times a day. Limit salt in your diet. Prenatal care  Schedule your prenatal visits by the twelfth week of pregnancy. They are usually scheduled monthly at first, then more often in the last 2 months before delivery.  Write down your questions. Take them to your prenatal visits.  Keep all your prenatal visits as told by your health care provider. This is important. Safety  Wear your seat belt at all times when driving.  Make a list of emergency phone numbers, including numbers for family, friends, the hospital, and police and fire departments. General instructions  Ask your health care provider for a referral to a local prenatal education class. Begin classes no later than the beginning of month 6 of your pregnancy.  Ask for help if you have counseling or nutritional needs during pregnancy. Your health care provider can offer advice or refer you to specialists for help  with various needs.  Do not use hot tubs, steam rooms, or saunas.  Do not douche or use tampons or scented sanitary pads.  Do not cross your legs for long periods of time.  Avoid cat litter boxes and soil used by cats. These carry germs that can cause birth defects in the baby and possibly loss of the fetus by miscarriage or stillbirth.  Avoid all smoking, herbs, alcohol, and medicines not prescribed by your health care provider. Chemicals in these products affect the formation and growth of the baby.  Do not use any products that contain nicotine or tobacco, such as cigarettes and e-cigarettes. If you need help quitting, ask your health care provider. You may receive counseling support and other resources to help you quit.  Schedule a dentist appointment. At home, brush your teeth with a soft toothbrush and be gentle when you floss. Contact a health care provider if:  You have dizziness.  You have mild pelvic cramps, pelvic pressure, or nagging pain in the abdominal area.  You have persistent nausea, vomiting, or diarrhea.  You have a bad smelling vaginal discharge.  You have pain when you urinate.  You notice increased swelling in your face, hands, legs, or ankles.  You are exposed to fifth disease or chickenpox.  You are exposed to German measles (rubella) and have never had it. Get help right away if:  You have a fever.  You are leaking fluid from your vagina.  You have spotting or bleeding from your vagina.  You have severe abdominal cramping or pain.  You have rapid weight gain or loss.  You vomit blood or material that looks like coffee grounds.  You develop a severe headache.  You have shortness of breath.  You have any kind of trauma, such as from a fall or a car accident. Summary  The first trimester of pregnancy is from week 1 until the end of week 13 (months 1 through 3).  Your body goes through many changes during pregnancy. The changes vary from  woman to woman.  You will have routine prenatal visits. During those visits, your health care provider will examine you, discuss any test results you may have, and talk with you about how you are feeling. This information is not intended to replace advice given to you by your health care provider. Make sure you discuss any questions you have with your health care provider. Document Revised: 05/15/2017 Document Reviewed: 05/14/2016 Elsevier Patient Education  2020 [employer]  

## 2019-11-10 NOTE — Progress Notes (Signed)
Subjective:  Chelsey Cook is a 34 y.o. [phone] at [redacted]w[redacted]d being seen today for first OB visit. EDD by LMP and confirmed by first trimester U/S. H/O TSVD in the past without problems. Denies any chronic medical problems or medications.  She is currently monitored for the following issues for this low-risk pregnancy and has Fibroids and Supervision of other normal pregnancy, antepartum on their problem list.  Patient reports no complaints.   .  .   . Denies leaking of fluid.   The following portions of the patient's history were reviewed and updated as appropriate: allergies, current medications, past family history, past medical history, past social history, past surgical history and problem list. Problem list updated.  Objective:   Vitals:   11/10/19 0956  BP: 124/85  Pulse: (!) 102  Weight: 150 lb (68 kg)    Fetal Status:           General:  Alert, oriented and cooperative. Patient is in no acute distress.  Skin: Skin is warm and dry. No rash noted.   Cardiovascular: Normal heart rate noted  Respiratory: Normal respiratory effort, no problems with respiration noted  Abdomen: Soft, gravid, appropriate for gestational age.       Pelvic:  Cervical exam performed        Extremities: Normal range of motion.     Mental Status: Normal mood and affect. Normal behavior. Normal judgment and thought content.   Urinalysis:      Assessment and Plan:  Pregnancy: [id number] at [redacted]w[redacted]d  1. Supervision of other normal pregnancy, antepartum Prenatal labs and care reviewed with pt Genetic testing reviewed - Genetic Screening - Cervicovaginal ancillary only - Obstetric Panel, Including HIV - Culture, OB Urine - [place] MFM OB COMP + 14 WK; Future  Preterm labor symptoms and general obstetric precautions including but not limited to vaginal bleeding, contractions, leaking of fluid and fetal movement were reviewed in detail with the patient. Please refer to After Visit Summary for other counseling  recommendations.  Return in about 4 weeks (around 12/08/2019) for virtual, any provider.   Chancy Milroy, MD

## 2019-11-11 LAB — CERVICOVAGINAL ANCILLARY ONLY
Bacterial Vaginitis (gardnerella): POSITIVE — AB
Candida Glabrata: NEGATIVE
Candida Vaginitis: POSITIVE — AB
Chlamydia: NEGATIVE
Comment: NEGATIVE
Comment: NEGATIVE
Comment: NEGATIVE
Comment: NEGATIVE
Comment: NEGATIVE
Comment: NORMAL
Neisseria Gonorrhea: NEGATIVE
Trichomonas: NEGATIVE

## 2019-11-11 LAB — OBSTETRIC PANEL, INCLUDING HIV
Antibody Screen: NEGATIVE
Basophils Absolute: 0 10*3/uL (ref 0.0–0.2)
Basos: 0 %
EOS (ABSOLUTE): 0.2 10*3/uL (ref 0.0–0.4)
Eos: 3 %
HIV Screen 4th Generation wRfx: NONREACTIVE
Hematocrit: 37.8 % (ref 34.0–46.6)
Hemoglobin: 12.8 g/dL (ref 11.1–15.9)
Hepatitis B Surface Ag: NEGATIVE
Immature Grans (Abs): 0.1 10*3/uL (ref 0.0–0.1)
Immature Granulocytes: 1 %
Lymphocytes Absolute: 1.7 10*3/uL (ref 0.7–3.1)
Lymphs: 18 %
MCH: 31.7 pg (ref 26.6–33.0)
MCHC: 33.9 g/dL (ref 31.5–35.7)
MCV: 94 fL (ref 79–97)
Monocytes Absolute: 0.7 10*3/uL (ref 0.1–0.9)
Monocytes: 7 %
Neutrophils Absolute: 6.4 10*3/uL (ref 1.4–7.0)
Neutrophils: 71 %
Platelets: 285 10*3/uL (ref 150–450)
RBC: 4.04 x10E6/uL (ref 3.77–5.28)
RDW: 12.5 % (ref 11.7–15.4)
RPR Ser Ql: NONREACTIVE
Rh Factor: POSITIVE
Rubella Antibodies, IGG: 12.6 index (ref >0.99)
WBC: 9.1 10*3/uL (ref 3.4–10.8)

## 2019-11-12 LAB — URINE CULTURE, OB REFLEX

## 2019-11-12 LAB — CULTURE, OB URINE

## 2019-11-15 ENCOUNTER — Other Ambulatory Visit: Payer: Self-pay

## 2019-11-15 DIAGNOSIS — B9689 Other specified bacterial agents as the cause of diseases classified elsewhere: Secondary | ICD-10-CM

## 2019-11-15 DIAGNOSIS — B373 Candidiasis of vulva and vagina: Secondary | ICD-10-CM

## 2019-11-15 DIAGNOSIS — B3731 Acute candidiasis of vulva and vagina: Secondary | ICD-10-CM

## 2019-11-15 DIAGNOSIS — N760 Acute vaginitis: Secondary | ICD-10-CM

## 2019-11-15 MED ORDER — TERCONAZOLE 0.8 % VA CREA: 1.0000 | TOPICAL_CREAM | Freq: Every day | VAGINAL | 0 refills | Status: DC

## 2019-11-15 MED ORDER — METRONIDAZOLE 500 MG PO TABS: 500.0000 mg | ORAL_TABLET | Freq: Two times a day (BID) | ORAL | 0 refills | Status: DC

## 2019-11-15 NOTE — Progress Notes (Signed)
Rx sent as advised per provider.

## 2019-11-16 ENCOUNTER — Telehealth: Payer: Self-pay

## 2019-11-16 NOTE — Telephone Encounter (Signed)
Called to ask questions about paperwork, no answer, left vm.

## 2019-11-17 ENCOUNTER — Encounter: Payer: Self-pay | Admitting: Obstetrics and Gynecology

## 2019-11-17 DIAGNOSIS — Z3493 Encounter for supervision of normal pregnancy, unspecified, third trimester: Secondary | ICD-10-CM

## 2019-11-21 ENCOUNTER — Encounter: Payer: Self-pay | Admitting: Obstetrics and Gynecology

## 2019-11-21 ENCOUNTER — Telehealth: Payer: Self-pay

## 2019-11-21 NOTE — Telephone Encounter (Signed)
S/w pt about her paperwork, advised to get info from HR about what needs to be fixed and call back, leaving a detailed message.

## 2019-11-23 ENCOUNTER — Encounter: Payer: Self-pay | Admitting: Obstetrics and Gynecology

## 2019-11-23 DIAGNOSIS — O285 Abnormal chromosomal and genetic finding on antenatal screening of mother: Secondary | ICD-10-CM | POA: Insufficient documentation

## 2019-11-24 ENCOUNTER — Encounter: Payer: Self-pay | Admitting: Obstetrics and Gynecology

## 2019-12-04 ENCOUNTER — Other Ambulatory Visit: Payer: Self-pay

## 2019-12-04 ENCOUNTER — Encounter (HOSPITAL_COMMUNITY): Payer: Self-pay | Admitting: Obstetrics and Gynecology

## 2019-12-04 ENCOUNTER — Inpatient Hospital Stay (HOSPITAL_COMMUNITY)
Admission: AD | Admit: 2019-12-04 | Discharge: 2019-12-04 | Disposition: A | Discharge status: Home / Self Care | Payer: Medicaid Other | Attending: Obstetrics and Gynecology | Admitting: Obstetrics and Gynecology

## 2019-12-04 DIAGNOSIS — Z886 Allergy status to analgesic agent status: Secondary | ICD-10-CM | POA: Diagnosis not present

## 2019-12-04 DIAGNOSIS — O26899 Other specified pregnancy related conditions, unspecified trimester: Secondary | ICD-10-CM

## 2019-12-04 DIAGNOSIS — O26892 Other specified pregnancy related conditions, second trimester: Secondary | ICD-10-CM | POA: Diagnosis not present

## 2019-12-04 DIAGNOSIS — R1030 Lower abdominal pain, unspecified: Secondary | ICD-10-CM | POA: Diagnosis not present

## 2019-12-04 DIAGNOSIS — Z3480 Encounter for supervision of other normal pregnancy, unspecified trimester: Secondary | ICD-10-CM

## 2019-12-04 DIAGNOSIS — Z3A15 15 weeks gestation of pregnancy: Secondary | ICD-10-CM | POA: Insufficient documentation

## 2019-12-04 DIAGNOSIS — O219 Vomiting of pregnancy, unspecified: Secondary | ICD-10-CM | POA: Diagnosis not present

## 2019-12-04 LAB — URINALYSIS, ROUTINE W REFLEX MICROSCOPIC
Bilirubin Urine: NEGATIVE
Glucose, UA: NEGATIVE mg/dL
Hgb urine dipstick: NEGATIVE
Ketones, ur: 20 mg/dL — AB
Nitrite: NEGATIVE
Protein, ur: NEGATIVE mg/dL
Specific Gravity, Urine: 1.016 (ref 1.005–1.030)
pH: 5 (ref 5.0–8.0)

## 2019-12-04 LAB — OB RESULTS CONSOLE GBS: GBS: POSITIVE

## 2019-12-04 MED ORDER — METOCLOPRAMIDE HCL 5 MG/ML IJ SOLN
10.0000 mg | Freq: Once | INTRAMUSCULAR | Status: AC
  Administered 2019-12-04: 10 mg via INTRAVENOUS
  Filled 2019-12-04: qty 2

## 2019-12-04 MED ORDER — LACTATED RINGERS IV SOLN: Freq: Once | INTRAVENOUS | Status: AC

## 2019-12-04 NOTE — MAU Provider Note (Signed)
History     CSN: [id number]  Arrival date and time: 12/04/19 1806   First Provider Initiated Contact with Patient 12/04/19 1900      Chief Complaint  Patient presents with  . Abdominal Pain  . Emesis  . Nausea   HPI Chelsey Cook 34 y.o. [redacted]w[redacted]d Comes to MAU with vomiting and nausea today.  Feels like she has been gassy and now feels lower abdominal cramping.  Tried to eat a salad today and immediately vomited it.  Feels like she cannot eat food or she will vomit again.  Was at a cookout last night and wonders if these symptoms are coming from something she ate.  Took one GasX at home and now is not as gassy but still feeling lower abdominal cramping.  OB History    Gravida  5   Para  1   Term  1   Preterm      AB  3   Living  1     SAB  1   TAB  1   Ectopic      Multiple      Live Births  1           Past Medical History:  Diagnosis Date  . Fibroid   . Vaginal Pap smear, abnormal     Past Surgical History:  Procedure Laterality Date  . COLPOSCOPY    . THERAPEUTIC ABORTION    . WISDOM TOOTH EXTRACTION      Family History  Problem Relation Age of Onset  . Obesity Mother   . Hypertension Maternal Grandmother   . Obesity Maternal Grandmother   . Cancer Paternal Grandmother     Social History   Tobacco Use  . Smoking status: Never Smoker  . Smokeless tobacco: Never Used  Vaping Use  . Vaping Use: Never used  Substance Use Topics  . Alcohol use: Yes    Comment: occasionally  . Drug use: No    Allergies:  Allergies  Allergen Reactions  . Naproxen Anaphylaxis    Medications Prior to Admission  Medication Sig Dispense Refill Last Dose  . acetaminophen (TYLENOL) 325 MG tablet Take 650 mg by mouth daily as needed for mild pain.      . Blood Pressure Monitoring (BLOOD PRESSURE KIT) DEVI 1 kit by Does not apply route once a week. Check Blood Pressure regularly and record readings into the [employer] App.  Large Cuff.  DX O90.0 1 each  0   . metroNIDAZOLE (FLAGYL) 500 MG tablet Take 1 tablet (500 mg total) by mouth 2 (two) times daily. 14 tablet 0   . Multiple Vitamin (MULTIVITAMIN) LIQD Take 5 mLs by mouth daily.     .Marland Kitchenterconazole (TERAZOL 3) 0.8 % vaginal cream Place 1 applicator vaginally at bedtime. Apply nightly for three nights. 20 g 0     Review of Systems  Constitutional: Negative for fever.  Respiratory: Negative for cough, shortness of breath and wheezing.   Gastrointestinal: Positive for abdominal pain, nausea and vomiting.  Genitourinary: Negative for dysuria, vaginal bleeding and vaginal discharge.   Physical Exam   Blood pressure 124/70, pulse 90, temperature (!) 97.4 F (36.3 C), temperature source Oral, resp. rate 16, height 5' 3"  (1.6 m), weight 69.7 kg, last menstrual period 08/16/2019, SpO2 100 %, unknown if currently breastfeeding.  Physical Exam  Nursing note and vitals reviewed. Constitutional: She is oriented to person, place, and time. She appears well-developed.  HENT:  Head: Normocephalic.  Cardiovascular: Normal rate.  Respiratory: Effort normal.  GI: Soft. There is no abdominal tenderness. There is no rebound and no guarding.  Uterus palpated and is very firm and softening then noted.  Musculoskeletal:        General: Normal range of motion.     Cervical back: Neck supple.  Neurological: She is alert and oriented to person, place, and time.  Skin: Skin is warm and dry.    MAU Course  Procedures LABS Results for orders placed or performed during the hospital encounter of 12/04/19 (from the past 24 hour(s))  Urinalysis, Routine w reflex microscopic     Status: Abnormal   Collection Time: 12/04/19  6:56 PM  Result Value Ref Range   Color, Urine YELLOW YELLOW   APPearance HAZY (A) CLEAR   Specific Gravity, Urine 1.016 1.005 - 1.030   pH 5.0 5.0 - 8.0   Glucose, UA NEGATIVE NEGATIVE mg/dL   Hgb urine dipstick NEGATIVE NEGATIVE   Bilirubin Urine NEGATIVE NEGATIVE   Ketones, ur 20  (A) NEGATIVE mg/dL   Protein, ur NEGATIVE NEGATIVE mg/dL   Nitrite NEGATIVE NEGATIVE   Leukocytes,Ua LARGE (A) NEGATIVE   RBC / HPF 0-5 0 - 5 RBC/hpf   WBC, UA 11-20 0 - 5 WBC/hpf   Bacteria, UA RARE (A) NONE SEEN   Squamous Epithelial / LPF 11-20 0 - 5   Mucus PRESENT     MDM Based on palpation of uterus and client's description of pain as "cramping" would suspect she is not hydrated well due to nausea and vomiting and is having some contractions.  Will give IFV and check to see if cramping is resoved.  Will give Reglan IV to see if it will help with gassiness. 11-20 WBCs in urine, no nitrites - ob urine culture add on ordered  Client feeling better with IVF today and having less cramping. 2050 - Client is much better - no cramping at this time.  400 cc to go to complete 1000cc IVF.  Reglan given earlier.  Will plan for discharge when fluids completed.  Assessment and Plan  Nausea and vomiting in pregnancy - single episode and may be related to gastrointestinal upset or mild viral syndrome  Plan Return if cramping worsens or if you develop worse vomiting Return with any vaginal bleeding or leaking of fluid Keep your appointments as scheduled and call for a sooner appointment if you are not doing well.  Brailen Macneal L Eyla Tallon 12/04/2019, 7:09 PM

## 2019-12-04 NOTE — Discharge Instructions (Signed)
Return if cramping worsens or if you develop worse vomiting Return with any vaginal bleeding or leaking of fluid Keep your appointments as scheduled and call for a sooner appointment if you are not doing well.

## 2019-12-04 NOTE — MAU Note (Signed)
Chelsey Cook is a 34 y.o. at [redacted]w[redacted]d here in MAU reporting: states she went to a cookout yesterday and after that she started having abdominal pain and then vomited once today after eating a salad. Has been feeling nauseous since then. Thinks it might be gas pain. No bleeding or discharge.  Onset of complaint: yesterday  Pain score: 6/10  Vitals:   12/04/19 1833  BP: 124/70  Pulse: 90  Resp: 16  Temp: (!) 97.4 F (36.3 C)  SpO2: 100%     FHT: 161  Lab orders placed from triage: UA

## 2019-12-06 LAB — CULTURE, OB URINE: Culture: 50000 — AB

## 2019-12-07 ENCOUNTER — Telehealth: Payer: Self-pay | Admitting: Student

## 2019-12-07 ENCOUNTER — Other Ambulatory Visit: Payer: Self-pay | Admitting: Student

## 2019-12-07 DIAGNOSIS — R8271 Bacteriuria: Secondary | ICD-10-CM | POA: Insufficient documentation

## 2019-12-07 MED ORDER — AMOXICILLIN 875 MG PO TABS
875.0000 mg | ORAL_TABLET | Freq: Two times a day (BID) | ORAL | 0 refills | Status: DC
Start: 2019-12-07 — End: 2020-04-18

## 2019-12-07 NOTE — Telephone Encounter (Signed)
Called patient and informed her of her culture results; patient denies allergies and will pick up amoxicillin today. Informed her that she will need prophylactic treatment in labor as well.   Chelsey Cook

## 2019-12-08 ENCOUNTER — Encounter: Payer: Self-pay | Admitting: Obstetrics

## 2019-12-08 ENCOUNTER — Telehealth (INDEPENDENT_AMBULATORY_CARE_PROVIDER_SITE_OTHER): Payer: Medicaid Other | Admitting: Obstetrics

## 2019-12-08 DIAGNOSIS — O99820 Streptococcus B carrier state complicating pregnancy: Secondary | ICD-10-CM

## 2019-12-08 DIAGNOSIS — R8271 Bacteriuria: Secondary | ICD-10-CM

## 2019-12-08 DIAGNOSIS — Z3480 Encounter for supervision of other normal pregnancy, unspecified trimester: Secondary | ICD-10-CM

## 2019-12-08 DIAGNOSIS — Z3A16 16 weeks gestation of pregnancy: Secondary | ICD-10-CM

## 2019-12-08 MED ORDER — VITAFOL FE+ 90-0.6-0.4-200 MG PO CAPS: 1.0000 | ORAL_CAPSULE | Freq: Every day | ORAL | 4 refills | Status: DC

## 2019-12-08 NOTE — Progress Notes (Signed)
S/w pt for virtual visit, reports fetal flutter movements, denies pain. Pt states that BP cuff does not have batteries.

## 2019-12-08 NOTE — Progress Notes (Signed)
   OBSTETRICS PRENATAL VIRTUAL VISIT ENCOUNTER NOTE  Provider location: [facility] [place]   I connected with Chelsey Cook on 12/08/19 at  8:30 AM EDT by MyChart Video Encounter at home and verified that I am speaking with the correct person using two identifiers.   I discussed the limitations, risks, security and privacy concerns of performing an evaluation and management service virtually and the availability of in person appointments. I also discussed with the patient that there may be a patient responsible charge related to this service. The patient expressed understanding and agreed to proceed. Subjective:  Chelsey Cook is a 34 y.o. [phone] at [redacted]w[redacted]d being seen today for ongoing prenatal care.  She is currently monitored for the following issues for this low-risk pregnancy and has Fibroids; Supervision of other normal pregnancy, antepartum; Abnormal genetic test during pregnancy; and Group B streptococcal bacteriuria on their problem list.  Patient reports no complaints.  Contractions: Not present. Vag. Bleeding: None.  Movement: Present. Denies any leaking of fluid.   The following portions of the patient's history were reviewed and updated as appropriate: allergies, current medications, past family history, past medical history, past social history, past surgical history and problem list.   Objective:  There were no vitals filed for this visit.  Fetal Status:     Movement: Present     General:  Alert, oriented and cooperative. Patient is in no acute distress.  Respiratory: Normal respiratory effort, no problems with respiration noted  Mental Status: Normal mood and affect. Normal behavior. Normal judgment and thought content.  Rest of physical exam deferred due to type of encounter  Imaging: No results found.  Assessment and Plan:  Pregnancy: [id number] at [redacted]w[redacted]d 1. Supervision of other normal pregnancy, antepartum Rx: - Prenat-Fe Poly-Methfol-FA-DHA  (VITAFOL FE+) 90-0.6-0.4-200 MG CAPS; Take 1 tablet by mouth daily.  Dispense: 90 capsule; Refill: 4  2. Group B streptococcal bacteriuria - treat in labor  Preterm labor symptoms and general obstetric precautions including but not limited to vaginal bleeding, contractions, leaking of fluid and fetal movement were reviewed in detail with the patient. I discussed the assessment and treatment plan with the patient. The patient was provided an opportunity to ask questions and all were answered. The patient agreed with the plan and demonstrated an understanding of the instructions. The patient was advised to call back or seek an in-person office evaluation/go to MAU at [facility] for any urgent or concerning symptoms. Please refer to After Visit Summary for other counseling recommendations.   I provided 10 minutes of face-to-face time during this encounter.  Return in about 4 weeks (around 01/05/2020) for MyChart.  Future Appointments  Date Time Provider [place]  12/29/2019  9:45 AM [facility] US[facility] [place], [place] [facility] [place] [facility] 12/08/19

## 2019-12-21 ENCOUNTER — Encounter: Payer: Self-pay | Admitting: Obstetrics and Gynecology

## 2019-12-29 ENCOUNTER — Ambulatory Visit: Payer: Medicaid Other | Attending: Obstetrics and Gynecology

## 2019-12-29 ENCOUNTER — Other Ambulatory Visit: Payer: Self-pay | Admitting: Obstetrics and Gynecology

## 2019-12-29 ENCOUNTER — Other Ambulatory Visit: Payer: Self-pay

## 2019-12-29 DIAGNOSIS — Z3480 Encounter for supervision of other normal pregnancy, unspecified trimester: Secondary | ICD-10-CM | POA: Insufficient documentation

## 2019-12-29 DIAGNOSIS — Z3A19 19 weeks gestation of pregnancy: Secondary | ICD-10-CM

## 2019-12-29 DIAGNOSIS — D261 Other benign neoplasm of corpus uteri: Secondary | ICD-10-CM

## 2019-12-29 DIAGNOSIS — O3412 Maternal care for benign tumor of corpus uteri, second trimester: Secondary | ICD-10-CM

## 2019-12-29 DIAGNOSIS — O09522 Supervision of elderly multigravida, second trimester: Secondary | ICD-10-CM | POA: Diagnosis not present

## 2020-01-05 ENCOUNTER — Encounter: Payer: Self-pay | Admitting: Obstetrics

## 2020-01-05 ENCOUNTER — Telehealth (INDEPENDENT_AMBULATORY_CARE_PROVIDER_SITE_OTHER): Payer: Medicaid Other | Admitting: Obstetrics

## 2020-01-05 VITALS — BP 124/77 | HR 108

## 2020-01-05 DIAGNOSIS — Z3A20 20 weeks gestation of pregnancy: Secondary | ICD-10-CM

## 2020-01-05 DIAGNOSIS — O99820 Streptococcus B carrier state complicating pregnancy: Secondary | ICD-10-CM

## 2020-01-05 DIAGNOSIS — R8271 Bacteriuria: Secondary | ICD-10-CM

## 2020-01-05 DIAGNOSIS — Z3480 Encounter for supervision of other normal pregnancy, unspecified trimester: Secondary | ICD-10-CM

## 2020-01-05 NOTE — Progress Notes (Signed)
Pt. Request pre natal vitamins to be sent to [facility].

## 2020-01-05 NOTE — Progress Notes (Signed)
OBSTETRICS PRENATAL VIRTUAL VISIT ENCOUNTER NOTE  Provider location: [facility] [place]   I connected with Zoii I Cook on 01/05/20 at 11:15 AM EDT by MyChart Video Encounter at home and verified that I am speaking with the correct person using two identifiers.   I discussed the limitations, risks, security and privacy concerns of performing an evaluation and management service virtually and the availability of in person appointments. I also discussed with the patient that there may be a patient responsible charge related to this service. The patient expressed understanding and agreed to proceed. Subjective:  Chelsey Cook is a 34 y.o. [phone] at [redacted]w[redacted]d being seen today for ongoing prenatal care.  She is currently monitored for the following issues for this low-risk pregnancy and has Fibroids; Supervision of other normal pregnancy, antepartum; Abnormal genetic test during pregnancy; and Group B streptococcal bacteriuria on their problem list.  Patient reports no complaints.  Contractions: Not present. Vag. Bleeding: None.  Movement: Present. Denies any leaking of fluid.   The following portions of the patient's history were reviewed and updated as appropriate: allergies, current medications, past family history, past medical history, past social history, past surgical history and problem list.   Objective:   Vitals:   01/05/20 1041  BP: 124/77  Pulse: (!) 108    Fetal Status:     Movement: Present     General:  Alert, oriented and cooperative. Patient is in no acute distress.  Respiratory: Normal respiratory effort, no problems with respiration noted  Mental Status: Normal mood and affect. Normal behavior. Normal judgment and thought content.  Rest of physical exam deferred due to type of encounter  Imaging: [place] MFM OB DETAIL +14 WK  Result Date: 12/29/2019 ----------------------------------------------------------------------  OBSTETRICS REPORT                        (Signed Final 12/29/2019 12:47 pm) ---------------------------------------------------------------------- Patient Info  ID #:       [id number]                          D.O.B.:  1986-01-27 (34 yrs)  Name:       Chelsey Cook               Visit Date: 12/29/2019 09:31 am ---------------------------------------------------------------------- Performed By  Attending:        Tama High MD        Ref. Address:     [place],                                                             [place] [address]  Performed By:     Rodrigo Ran BS      [facility]                                                             [facility]  Referred By:      Tresea Mall CNM ---------------------------------------------------------------------- Orders  #  Description                           Code        Ordered By  1  [place] MFM OB DETAIL +14 WK               76811.01    Arlina Robes ----------------------------------------------------------------------  #  Order #                     Accession #                Episode #  1  [id number]                   [id number]                 [id number] ---------------------------------------------------------------------- Indications  Antenatal screening for malformations          Z36.3  Advanced maternal age multigravida 58+,        O61.522  second trimester (low risk NIPS)  Genetic carrier (SMA, 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 Cord Insertion:      Visualized  Amniotic Fluid  AFI FV:      Within normal limits                              Largest Pocket(cm)                              5.3 ----------------------------------------------------------------------  Biometry  BPD:      46.2  mm     G. Age:  20w 0d         78  %    CI:        70.04   %    70 - 86                                                          FL/HC:      16.7   %    16.1 - 18.3  HC:      176.1  mm     G. Age:  20w 1d         80  %    HC/AC:      1.30        1.09 - 1.39  AC:      135.5  mm     G. Age:  19w 0d         36  %    FL/BPD:     63.6   %  FL:       29.4  mm     G. Age:  19w 0d         34  %    FL/AC:      21.7   %    20 - 24  HUM:      29.8  mm     G. Age:  19w 6d         64  %  CER:      20.5  mm     G. Age:  19w 5d         65  %  NFT:       4.5  mm  CM:        4.5  mm  Est. FW:     277  gm    0 lb 10 oz      38  % ---------------------------------------------------------------------- OB History  Gravidity:    5         Term:   1        Prem:   0        SAB:   3  TOP:          0       Ectopic:  0        Living: 1 ---------------------------------------------------------------------- Gestational Age  LMP:           19w 2d        Date:  08/16/19                 EDD:   05/22/20  U/S Today:     19w 4d                                        EDD:   05/20/20  Best:          19w 2d     Det. By:  LMP  (08/16/19)          EDD:   05/22/20 ---------------------------------------------------------------------- Anatomy  Cranium:  Appears normal         LVOT:                   Appears normal  Cavum:                 Appears normal         Aortic Arch:            Appears normal  Ventricles:            Appears normal         Ductal Arch:            Appears normal  Choroid Plexus:        Appears normal         Diaphragm:               Appears normal  Cerebellum:            Appears normal         Stomach:                Appears normal, left                                                                        sided  Posterior Fossa:       Appears normal         Abdomen:                Appears normal  Nuchal Fold:           Appears normal         Abdominal Wall:         Appears nml (cord                                                                        insert, abd wall)  Face:                  Appears normal         Cord Vessels:           Appears normal (3                         (orbits and profile)                           vessel cord)  Lips:                  Appears normal         Kidneys:                Appear normal  Palate:                Appears normal         Bladder:                Appears  normal  Thoracic:              Appears normal         Spine:                  Appears normal  Heart:                 Normal;                Upper Extremities:      Appears normal                         Echogenic focus                         in LV  RVOT:                  Appears normal         Lower Extremities:      Appears normal  Other:  Heels/feet and open hands/5th digits visualized. ---------------------------------------------------------------------- Cervix Uterus Adnexa  Cervix  Length:           3.46  cm.  Normal appearance by transabdominal scan.  Uterus  Multiple fibroids noted, see table below.  Right Ovary  Within normal limits.  Left Ovary  Within normal limits.  Cul De Sac  No free fluid seen.  Adnexa  No abnormality visualized. ---------------------------------------------------------------------- Myomas  Site                     L(cm)      W(cm)      D(cm)       Location  Anterior                 2.7        2.65       2.3  Anterior                 1.9        1.6        1.3  Left                     2.1        1.8        1.6 ----------------------------------------------------------------------  Blood Flow                  RI        PI       Comments ---------------------------------------------------------------------- Impression  We performed fetal anatomy scan. An echogenic intracardiac  focus is seen. No other makers of aneuploidies or fetal  structural defects are seen. Fetal biometry is consistent with  her previously-established dates. Amniotic fluid is normal and  good fetal activity is seen.  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 She does not have symptoms pertaining to the  myomas.  I informed the patient that given that she had low rik for fetal  aneuploidies on cell-free fetal DNA screening, finding of  echogenic intracardiac focus should be considered a normal  variant and that the risk of trisomy 21 is not increased. I also  reassured that echogenic focus does not increase the risk of  cardiac defects. I also informed her that only amniocentesis  will give a defintive result on the fetal karyotype.  Patient opted not to have amniocentesis. ---------------------------------------------------------------------- Recommendations  -An appointment was made  for her to return in 8 weeks for  fetal growth assessment and to evaluate the myomas. ----------------------------------------------------------------------                  Tama High, MD Electronically Signed Final Report   12/29/2019 12:47 pm ----------------------------------------------------------------------   Assessment and Plan:  Pregnancy: [id number] at [redacted]w[redacted]d 1. Supervision of other normal pregnancy, antepartum  2. Group B streptococcal bacteriuria - treat in labor   Preterm labor symptoms and general obstetric precautions including but not limited to vaginal bleeding, contractions, leaking of fluid and fetal movement were reviewed in detail with the patient. I discussed the assessment and treatment plan with the patient. The patient was provided an opportunity to ask questions and all were answered. The patient agreed with the plan  and demonstrated an understanding of the instructions. The patient was advised to call back or seek an in-person office evaluation/go to MAU at [facility] for any urgent or concerning symptoms. Please refer to After Visit Summary for other counseling recommendations.   I provided 10 minutes of face-to-face time during this encounter.  Return in about 4 weeks (around 02/02/2020) for MyChart.   Baltazar Najjar, MD [facility] 01/05/20

## 2020-01-17 ENCOUNTER — Encounter: Payer: Self-pay | Admitting: Obstetrics and Gynecology

## 2020-01-25 ENCOUNTER — Other Ambulatory Visit: Payer: Self-pay | Admitting: *Deleted

## 2020-01-25 DIAGNOSIS — O09529 Supervision of elderly multigravida, unspecified trimester: Secondary | ICD-10-CM

## 2020-01-27 ENCOUNTER — Other Ambulatory Visit (HOSPITAL_COMMUNITY)
Admission: RE | Admit: 2020-01-27 | Discharge: 2020-01-27 | Disposition: A | Discharge status: Home / Self Care | Payer: Medicaid Other | Source: Ambulatory Visit | Attending: Obstetrics and Gynecology | Admitting: Obstetrics and Gynecology

## 2020-01-27 ENCOUNTER — Ambulatory Visit (INDEPENDENT_AMBULATORY_CARE_PROVIDER_SITE_OTHER): Payer: Medicaid Other

## 2020-01-27 ENCOUNTER — Other Ambulatory Visit: Payer: Self-pay

## 2020-01-27 DIAGNOSIS — O26899 Other specified pregnancy related conditions, unspecified trimester: Secondary | ICD-10-CM | POA: Diagnosis not present

## 2020-01-27 DIAGNOSIS — N898 Other specified noninflammatory disorders of vagina: Secondary | ICD-10-CM

## 2020-01-27 DIAGNOSIS — R300 Dysuria: Secondary | ICD-10-CM

## 2020-01-27 DIAGNOSIS — Z3480 Encounter for supervision of other normal pregnancy, unspecified trimester: Secondary | ICD-10-CM

## 2020-01-27 LAB — POCT URINALYSIS DIPSTICK
Bilirubin, UA: NEGATIVE
Glucose, UA: NEGATIVE
Ketones, UA: NEGATIVE
Nitrite, UA: NEGATIVE
Protein, UA: POSITIVE — AB
Spec Grav, UA: 1.025 (ref 1.010–1.025)
Urobilinogen, UA: 0.2 E.U./dL
pH, UA: 6.5 (ref 5.0–8.0)

## 2020-01-27 MED ORDER — NITROFURANTOIN MONOHYD MACRO 100 MG PO CAPS: 100.0000 mg | ORAL_CAPSULE | Freq: Two times a day (BID) | ORAL | 0 refills | Status: DC

## 2020-01-27 MED ORDER — VITAFOL FE+ 90-0.6-0.4-200 MG PO CAPS: 1.0000 | ORAL_CAPSULE | Freq: Every day | ORAL | 4 refills | Status: AC

## 2020-01-27 NOTE — Progress Notes (Signed)
SUBJECTIVE: Chelsey Cook is a 34 y.o. female who complains of urinary frequency, urgency and dysuria x 14 days, without flank pain, fever, chills, or abnormal notes vaginal discharge no bleeding.   OBJECTIVE: Appears well, in no apparent distress.  Vital signs are normal. Urine dipstick shows positive for leukocytes.    ASSESSMENT: Dysuria and Vaginal Discharge  PLAN: Treatment per orders. Will check for BV and yeast only  Call or return to clinic prn if these symptoms worsen or fail to improve as anticipated.

## 2020-01-27 NOTE — Progress Notes (Signed)
I have reviewed this chart and agree with the RN/CMA assessment and management.    K. Meryl Alpha Chouinard, M.D. Attending [facility])   

## 2020-01-30 LAB — CULTURE, OB URINE

## 2020-01-30 LAB — URINE CULTURE, OB REFLEX

## 2020-01-30 LAB — CERVICOVAGINAL ANCILLARY ONLY
Bacterial Vaginitis (gardnerella): NEGATIVE
Candida Glabrata: NEGATIVE
Candida Vaginitis: POSITIVE — AB
Comment: NEGATIVE
Comment: NEGATIVE
Comment: NEGATIVE

## 2020-02-01 ENCOUNTER — Other Ambulatory Visit: Payer: Self-pay | Admitting: Obstetrics and Gynecology

## 2020-02-01 MED ORDER — CLOTRIMAZOLE 1 % VA CREA
1.0000 | TOPICAL_CREAM | Freq: Every day | VAGINAL | 2 refills | Status: DC
Start: 2020-02-01 — End: 2020-04-18

## 2020-02-02 ENCOUNTER — Telehealth (INDEPENDENT_AMBULATORY_CARE_PROVIDER_SITE_OTHER): Payer: Medicaid Other | Admitting: Certified Nurse Midwife

## 2020-02-02 ENCOUNTER — Encounter: Payer: Self-pay | Admitting: Certified Nurse Midwife

## 2020-02-02 VITALS — BP 121/68 | HR 98

## 2020-02-02 DIAGNOSIS — R8271 Bacteriuria: Secondary | ICD-10-CM

## 2020-02-02 DIAGNOSIS — Z3A24 24 weeks gestation of pregnancy: Secondary | ICD-10-CM

## 2020-02-02 DIAGNOSIS — O285 Abnormal chromosomal and genetic finding on antenatal screening of mother: Secondary | ICD-10-CM

## 2020-02-02 DIAGNOSIS — O99820 Streptococcus B carrier state complicating pregnancy: Secondary | ICD-10-CM

## 2020-02-02 DIAGNOSIS — Z3480 Encounter for supervision of other normal pregnancy, unspecified trimester: Secondary | ICD-10-CM

## 2020-02-02 NOTE — Progress Notes (Signed)
I connected with Chelsey Cook on 02/02/20 at  9:55 AM EDT by telephone and verified that I am speaking with the correct person using two identifiers.  Mychart visit for routine OB visit Pt has yeast infection; agrees to pick up meds today.

## 2020-02-02 NOTE — Patient Instructions (Signed)
Glucose Tolerance Test During Pregnancy Why am I having this test? The glucose tolerance test (GTT) is done to check how your body processes sugar (glucose). This is one of several tests used to diagnose diabetes that develops during pregnancy (gestational diabetes mellitus). Gestational diabetes is a temporary form of diabetes that some women develop during pregnancy. It usually occurs during the second trimester of pregnancy and goes away after delivery. Testing (screening) for gestational diabetes usually occurs between 24 and 28 weeks of pregnancy. You may have the GTT test after having a 1-hour glucose screening test if the results from that test indicate that you may have gestational diabetes. You may also have this test if:  You have a history of gestational diabetes.  You have a history of giving birth to very large babies or have experienced repeated fetal loss (stillbirth).  You have signs and symptoms of diabetes, such as: ? Changes in your vision. ? Tingling or numbness in your hands or feet. ? Changes in hunger, thirst, and urination that are not otherwise explained by your pregnancy. What is being tested? This test measures the amount of glucose in your blood at different times during a period of 3 hours. This indicates how well your body is able to process glucose. What kind of sample is taken?  Blood samples are required for this test. They are usually collected by inserting a needle into a blood vessel. How do I prepare for this test?  For 3 days before your test, eat normally. Have plenty of carbohydrate-rich foods.  Follow instructions from your health care provider about: ? Eating or drinking restrictions on the day of the test. You may be asked to not eat or drink anything other than water (fast) starting 8-10 hours before the test. ? Changing or stopping your regular medicines. Some medicines may interfere with this test. Tell a health care provider about:  All  medicines you are taking, including vitamins, herbs, eye drops, creams, and over-the-counter medicines.  Any blood disorders you have.  Any surgeries you have had.  Any medical conditions you have. What happens during the test? First, your blood glucose will be measured. This is referred to as your fasting blood glucose, since you fasted before the test. Then, you will drink a glucose solution that contains a certain amount of glucose. Your blood glucose will be measured again 1, 2, and 3 hours after drinking the solution. This test takes about 3 hours to complete. You will need to stay at the testing location during this time. During the testing period:  Do not eat or drink anything other than the glucose solution.  Do not exercise.  Do not use any products that contain nicotine or tobacco, such as cigarettes and e-cigarettes. If you need help stopping, ask your health care provider. The testing procedure may vary among health care providers and hospitals. How are the results reported? Your results will be reported as milligrams of glucose per deciliter of blood (mg/dL) or millimoles per liter (mmol/L). Your health care provider will compare your results to normal ranges that were established after testing a large group of people (reference ranges). Reference ranges may vary among labs and hospitals. For this test, common reference ranges are:  Fasting: less than 95-105 mg/dL (5.3-5.8 mmol/L).  1 hour after drinking glucose: less than 180-190 mg/dL (10.0-10.5 mmol/L).  2 hours after drinking glucose: less than 155-165 mg/dL (8.6-9.2 mmol/L).  3 hours after drinking glucose: 140-145 mg/dL (7.8-8.1 mmol/L). What do the   results mean? Results within reference ranges are considered normal, meaning that your glucose levels are well-controlled. If two or more of your blood glucose levels are high, you may be diagnosed with gestational diabetes. If only one level is high, your health care  provider may suggest repeat testing or other tests to confirm a diagnosis. Talk with your health care provider about what your results mean. Questions to ask your health care provider Ask your health care provider, or the department that is doing the test:  When will my results be ready?  How will I get my results?  What are my treatment options?  What other tests do I need?  What are my next steps? Summary  The glucose tolerance test (GTT) is one of several tests used to diagnose diabetes that develops during pregnancy (gestational diabetes mellitus). Gestational diabetes is a temporary form of diabetes that some women develop during pregnancy.  You may have the GTT test after having a 1-hour glucose screening test if the results from that test indicate that you may have gestational diabetes. You may also have this test if you have any symptoms or risk factors for gestational diabetes.  Talk with your health care provider about what your results mean. This information is not intended to replace advice given to you by your health care provider. Make sure you discuss any questions you have with your health care provider. Document Revised: 09/23/2018 Document Reviewed: 01/12/2017 Elsevier Patient Education  2020 Elsevier Inc.  

## 2020-02-02 NOTE — Progress Notes (Signed)
   OBSTETRICS PRENATAL VIRTUAL VISIT ENCOUNTER NOTE  Provider location: [facility] [place]   I connected with Chelsey Cook on 02/02/20 at  10:12 AM EDT by MyChart Video Encounter at home and verified that I am speaking with the correct person using two identifiers.   I discussed the limitations, risks, security and privacy concerns of performing an evaluation and management service virtually and the availability of in person appointments. I also discussed with the patient that there may be a patient responsible charge related to this service. The patient expressed understanding and agreed to proceed. Subjective:  Chelsey Cook is a 34 y.o. G5P1031 at [redacted]w[redacted]d being seen today for ongoing prenatal care.  She is currently monitored for the following issues for this low-risk pregnancy and has Fibroids; Supervision of other normal pregnancy, antepartum; Abnormal genetic test during pregnancy; and Group B streptococcal bacteriuria on their problem list.  Patient reports no complaints.  Contractions: Not present. Vag. Bleeding: None.  Movement: Present. Denies any leaking of fluid.   The following portions of the patient's history were reviewed and updated as appropriate: allergies, current medications, past family history, past medical history, past social history, past surgical history and problem list.   Objective:   Vitals:   02/02/20 0946  BP: 121/68  Pulse: 98    Fetal Status:     Movement: Present     General:  Alert, oriented and cooperative. Patient is in no acute distress.  Respiratory: Normal respiratory effort, no problems with respiration noted  Mental Status: Normal mood and affect. Normal behavior. Normal judgment and thought content.  Rest of physical exam deferred due to type of encounter  Imaging: No results found.  Assessment and Plan:  Pregnancy: [id number] at [redacted]w[redacted]d 1. Supervision of other normal pregnancy, antepartum - Patient doing well, no  complaints - Patient has just finished a walk and is trying to be consistent with exercising  - Routine prenatal care - Anticipatory guidance on upcoming appointments with next visit being in person for GTT. Educated and discussed to fast after MN prior to appointment.   2. [redacted] weeks gestation of pregnancy  3. Abnormal genetic test during pregnancy - genetic counseling completed   4. Group B streptococcal bacteriuria - TOC negative - educated and discussed will need to treat in labor    Preterm labor symptoms and general obstetric precautions including but not limited to vaginal bleeding, contractions, leaking of fluid and fetal movement were reviewed in detail with the patient. I discussed the assessment and treatment plan with the patient. The patient was provided an opportunity to ask questions and all were answered. The patient agreed with the plan and demonstrated an understanding of the instructions. The patient was advised to call back or seek an in-person office evaluation/go to [facility]U at [facility] for any urgent or concerning symptoms. Please refer to After Visit Summary for other counseling recommendations.   I provided 11 minutes of face-to-face time during this encounter.  Return in about 4 weeks (around 03/01/2020) for ROB/GTT.  Future Appointments  Date Time Provider [place]  02/23/2020  7:15 AM [facility] NURSE [facility]  02/23/2020  7:30 AM [facility] US3 [facility]  03/01/2020  8:15 AM [facility] LAB [facility] None  03/01/2020  8:35 AM Lajean Manes, CNM [facility] None    Lajean Manes, [place] for [employer], [place]

## 2020-02-23 ENCOUNTER — Ambulatory Visit: Payer: Medicaid Other | Attending: Obstetrics and Gynecology

## 2020-02-23 ENCOUNTER — Other Ambulatory Visit: Payer: Self-pay

## 2020-02-23 ENCOUNTER — Ambulatory Visit: Payer: Medicaid Other

## 2020-02-23 DIAGNOSIS — O34219 Maternal care for unspecified type scar from previous cesarean delivery: Secondary | ICD-10-CM

## 2020-02-23 DIAGNOSIS — O09522 Supervision of elderly multigravida, second trimester: Secondary | ICD-10-CM

## 2020-02-23 DIAGNOSIS — O09529 Supervision of elderly multigravida, unspecified trimester: Secondary | ICD-10-CM | POA: Insufficient documentation

## 2020-02-23 DIAGNOSIS — D259 Leiomyoma of uterus, unspecified: Secondary | ICD-10-CM

## 2020-02-23 DIAGNOSIS — Z148 Genetic carrier of other disease: Secondary | ICD-10-CM

## 2020-02-23 DIAGNOSIS — Z3A27 27 weeks gestation of pregnancy: Secondary | ICD-10-CM

## 2020-03-01 ENCOUNTER — Ambulatory Visit (INDEPENDENT_AMBULATORY_CARE_PROVIDER_SITE_OTHER): Payer: Medicaid Other | Admitting: Licensed Clinical Social Worker

## 2020-03-01 ENCOUNTER — Ambulatory Visit (INDEPENDENT_AMBULATORY_CARE_PROVIDER_SITE_OTHER): Payer: Medicaid Other | Admitting: Certified Nurse Midwife

## 2020-03-01 ENCOUNTER — Other Ambulatory Visit: Payer: Self-pay

## 2020-03-01 ENCOUNTER — Other Ambulatory Visit: Payer: Medicaid Other

## 2020-03-01 ENCOUNTER — Encounter: Payer: Self-pay | Admitting: Certified Nurse Midwife

## 2020-03-01 VITALS — BP 130/80 | HR 134 | Wt 165.6 lb

## 2020-03-01 DIAGNOSIS — Z3A28 28 weeks gestation of pregnancy: Secondary | ICD-10-CM

## 2020-03-01 DIAGNOSIS — O99340 Other mental disorders complicating pregnancy, unspecified trimester: Secondary | ICD-10-CM

## 2020-03-01 DIAGNOSIS — F419 Anxiety disorder, unspecified: Secondary | ICD-10-CM | POA: Insufficient documentation

## 2020-03-01 DIAGNOSIS — F4322 Adjustment disorder with anxiety: Secondary | ICD-10-CM

## 2020-03-01 DIAGNOSIS — Z3480 Encounter for supervision of other normal pregnancy, unspecified trimester: Secondary | ICD-10-CM

## 2020-03-01 DIAGNOSIS — R8271 Bacteriuria: Secondary | ICD-10-CM

## 2020-03-01 NOTE — Progress Notes (Signed)
Patient reports fetal movement, denies pain. Pt states she is nervous today and has anxiety. GAD-7=15

## 2020-03-01 NOTE — Patient Instructions (Signed)
Glucose Tolerance Test During Pregnancy Why am I having this test? The glucose tolerance test (GTT) is done to check how your body processes sugar (glucose). This is one of several tests used to diagnose diabetes that develops during pregnancy (gestational diabetes mellitus). Gestational diabetes is a temporary form of diabetes that some women develop during pregnancy. It usually occurs during the second trimester of pregnancy and goes away after delivery. Testing (screening) for gestational diabetes usually occurs between 24 and 28 weeks of pregnancy. You may have the GTT test after having a 1-hour glucose screening test if the results from that test indicate that you may have gestational diabetes. You may also have this test if:  You have a history of gestational diabetes.  You have a history of giving birth to very large babies or have experienced repeated fetal loss (stillbirth).  You have signs and symptoms of diabetes, such as: ? Changes in your vision. ? Tingling or numbness in your hands or feet. ? Changes in hunger, thirst, and urination that are not otherwise explained by your pregnancy. What is being tested? This test measures the amount of glucose in your blood at different times during a period of 3 hours. This indicates how well your body is able to process glucose. What kind of sample is taken?  Blood samples are required for this test. They are usually collected by inserting a needle into a blood vessel. How do I prepare for this test?  For 3 days before your test, eat normally. Have plenty of carbohydrate-rich foods.  Follow instructions from your health care provider about: ? Eating or drinking restrictions on the day of the test. You may be asked to not eat or drink anything other than water (fast) starting 8-10 hours before the test. ? Changing or stopping your regular medicines. Some medicines may interfere with this test. Tell a health care provider about:  All  medicines you are taking, including vitamins, herbs, eye drops, creams, and over-the-counter medicines.  Any blood disorders you have.  Any surgeries you have had.  Any medical conditions you have. What happens during the test? First, your blood glucose will be measured. This is referred to as your fasting blood glucose, since you fasted before the test. Then, you will drink a glucose solution that contains a certain amount of glucose. Your blood glucose will be measured again 1, 2, and 3 hours after drinking the solution. This test takes about 3 hours to complete. You will need to stay at the testing location during this time. During the testing period:  Do not eat or drink anything other than the glucose solution.  Do not exercise.  Do not use any products that contain nicotine or tobacco, such as cigarettes and e-cigarettes. If you need help stopping, ask your health care provider. The testing procedure may vary among health care providers and hospitals. How are the results reported? Your results will be reported as milligrams of glucose per deciliter of blood (mg/dL) or millimoles per liter (mmol/L). Your health care provider will compare your results to normal ranges that were established after testing a large group of people (reference ranges). Reference ranges may vary among labs and hospitals. For this test, common reference ranges are:  Fasting: less than 95-105 mg/dL (5.3-5.8 mmol/L).  1 hour after drinking glucose: less than 180-190 mg/dL (10.0-10.5 mmol/L).  2 hours after drinking glucose: less than 155-165 mg/dL (8.6-9.2 mmol/L).  3 hours after drinking glucose: 140-145 mg/dL (7.8-8.1 mmol/L). What do the   results mean? Results within reference ranges are considered normal, meaning that your glucose levels are well-controlled. If two or more of your blood glucose levels are high, you may be diagnosed with gestational diabetes. If only one level is high, your health care  provider may suggest repeat testing or other tests to confirm a diagnosis. Talk with your health care provider about what your results mean. Questions to ask your health care provider Ask your health care provider, or the department that is doing the test:  When will my results be ready?  How will I get my results?  What are my treatment options?  What other tests do I need?  What are my next steps? Summary  The glucose tolerance test (GTT) is one of several tests used to diagnose diabetes that develops during pregnancy (gestational diabetes mellitus). Gestational diabetes is a temporary form of diabetes that some women develop during pregnancy.  You may have the GTT test after having a 1-hour glucose screening test if the results from that test indicate that you may have gestational diabetes. You may also have this test if you have any symptoms or risk factors for gestational diabetes.  Talk with your health care provider about what your results mean. This information is not intended to replace advice given to you by your health care provider. Make sure you discuss any questions you have with your health care provider. Document Revised: 09/23/2018 Document Reviewed: 01/12/2017 Elsevier Patient Education  2020 Elsevier Inc.  

## 2020-03-01 NOTE — Progress Notes (Signed)
   PRENATAL VISIT NOTE  Subjective:  Chelsey Cook is a 34 y.o. G5P1031 at [redacted]w[redacted]d being seen today for ongoing prenatal care.  She is currently monitored for the following issues for this low-risk pregnancy and has Fibroids; Supervision of other normal pregnancy, antepartum; Abnormal genetic test during pregnancy; Group B streptococcal bacteriuria; and Anxiety during pregnancy on their problem list.  Patient reports anxiety.  Contractions: Not present. Vag. Bleeding: None.  Movement: Present. Denies leaking of fluid.   The following portions of the patient's history were reviewed and updated as appropriate: allergies, current medications, past family history, past medical history, past social history, past surgical history and problem list.   Objective:   Vitals:   03/01/20 0835  BP: 130/80  Pulse: (!) 161  Weight: 165 lb 9.6 oz (75.1 kg)  repeat pulse 130  Fetal Status: Fetal Heart Rate (bpm): 147 Fundal Height: 28 cm Movement: Present     General:  Alert, oriented and cooperative. Patient is in no acute distress.  Skin: Skin is warm and dry. No rash noted.   Cardiovascular: Normal heart rate noted  Respiratory: Normal respiratory effort, no problems with respiration noted  Abdomen: Soft, gravid, appropriate for gestational age.  Pain/Pressure: Absent     Pelvic: Cervical exam deferred        Extremities: Normal range of motion.  Edema: None  Mental Status: Normal mood and affect. Normal behavior. Normal judgment and thought content.   Assessment and Plan:  Pregnancy: [id number] at [redacted]w[redacted]d 1. Supervision of other normal pregnancy, antepartum - Routine prenatal care - Anticipatory guidance on upcoming appointments with next appointment in person due to the new onset of anxiety, then can switch back to virtual appointments   2. [redacted] weeks gestation of pregnancy - Glucose Tolerance, 2 Hours w/1 Hour - RPR - CBC - HIV antibody (with reflex)  3. Group B streptococcal bacteriuria -  Treat in labor   4. Anxiety during pregnancy - patient reports that she is very anxious today and during this pregnancy, GAD7 =15 - reports that she "just keeps thinking something is wrong"  - patient reports this is a new problem, has not had anxiety in past  - educated and discussed coping mechanisms - patient reports that she is walking daily for her "mental"  - Discussed appointment with Seth Bake for anxiety, patient agrees. Patient does not want medication at this time  - Ambulatory referral to [place]  Preterm labor symptoms and general obstetric precautions including but not limited to vaginal bleeding, contractions, leaking of fluid and fetal movement were reviewed in detail with the patient. Please refer to After Visit Summary for other counseling recommendations.   Return in about 2 weeks (around 03/15/2020) for LROB-in person, anxiety .  Future Appointments  Date Time Provider [place]  03/01/2020  1:15 PM Lynnea Ferrier, LCSW [facility] None  03/15/2020  8:35 AM Ephraim Hamburger, Rona Ravens, NP [place] None    Lajean Manes, CNM

## 2020-03-01 NOTE — BH Specialist Note (Signed)
Integrated Behavioral Health via Telemedicine Video (Caregility) Visit  03/01/2020 Chelsey Cook [id number]  Number of [place] visits: 1 Session Start time: 1:15pm  Session End time: 1:35pm Total time: 20 minutes via mychart   Referring Provider: Wende Bushy CNM Type of Service: Individual, Patient/Family location: [place]  [facility] Provider location: [place]  All persons participating in visit: LCSWA A. Autymn Omlor and Pt. Chelsey Cook    I connected with Chelsey Cook and/or Chelsey Cook's n/a by a video enabled telemedicine application ([place]) and verified that I am speaking with the correct person using two identifiers.   Discussed confidentiality: yes  Confirmed demographics & insurance:  yes  I discussed that engaging in this virtual visit, they consent to the provision of behavioral healthcare and the services will be billed under their insurance.   Patient and/or legal guardian expressed understanding and consented to virtual visit: yes  PRESENTING CONCERNS: Patient and/or family reports the following symptoms/concerns: worry, increased anxiety, difficulty sleeping  Duration of problem: approx 2 months; Severity of problem: mild  STRENGTHS (Protective Factors/Coping Skills): Chelsey Cook has a supportive family, and attends all scheduled prenatal appointments   ASSESSMENT: Patient currently experiencing adjustment disorder with anxious mood    GOALS ADDRESSED: Patient will: 1.  Reduce symptoms of: increase anxiety, worry, difficulty sleeping  2.  Increase knowledge of coping skills to alleviate increase anxiety   3.  Demonstrate ability to: self manage symptoms   Progress of Goals: Chelsey Cook is eager to implement coping skills discussed during today's appt. Chelsey Cook will implement light physical activity such as walking and prenatal yoga to boost mood and decrease stress. Chelsey Cook report she will replace looking up  diagnosis (such as geriatric pregnancy) on google and will begin organizing and preparing home for newborn arrival.   INTERVENTIONS: Interventions utilized:  CBT Standardized Assessments completed & reviewed:    Routine Prenatal from 03/01/2020 in [place]  PHQ-9 Total Score 2     GAD 7 : Generalized Anxiety Score 03/01/2020  Nervous, Anxious, on Edge 3  Control/stop worrying 3  Worry too much - different things 3  Trouble relaxing 3  Restless 0  Easily annoyed or irritable 0  Afraid - awful might happen 3  Total GAD 7 Score 15  Anxiety Difficulty Not difficult at all      OUTCOME: Patient Response: Chelsey Cook is highly motivated during this time to implement coping skills    PLAN: 1. Follow up with behavioral health clinician on : 3 weeks  2. Behavioral recommendations: light physical activity such as walking and prenatal yoga, modify and prioritize sleep; avoid medical jargon on google and avoid electronic before going to sleep and continue taking prenatal vitamins.  3. Referral(s): none  I discussed the assessment and treatment plan with the patient and/or parent/guardian. They were provided an opportunity to ask questions and all were answered. They agreed with the plan and demonstrated an understanding of the instructions.   They were advised to call back or seek an in-person evaluation as appropriate.  I discussed that the purpose of this visit is to provide behavioral health care while limiting exposure to the novel coronavirus.  Discussed there is a possibility of technology failure and discussed alternative modes of communication if that failure occurs.  Lynnea Ferrier

## 2020-03-02 LAB — CBC
Hematocrit: 35.3 % (ref 34.0–46.6)
Hemoglobin: 12.3 g/dL (ref 11.1–15.9)
MCH: 32.5 pg (ref 26.6–33.0)
MCHC: 34.8 g/dL (ref 31.5–35.7)
MCV: 93 fL (ref 79–97)
Platelets: 243 10*3/uL (ref 150–450)
RBC: 3.78 x10E6/uL (ref 3.77–5.28)
RDW: 12.8 % (ref 11.7–15.4)
WBC: 10.5 10*3/uL (ref 3.4–10.8)

## 2020-03-02 LAB — GLUCOSE TOLERANCE, 2 HOURS W/ 1HR
Glucose, 1 hour: 156 mg/dL (ref 65–179)
Glucose, 2 hour: 118 mg/dL (ref 65–152)
Glucose, Fasting: 84 mg/dL (ref 65–91)

## 2020-03-02 LAB — RPR: RPR Ser Ql: NONREACTIVE

## 2020-03-02 LAB — HIV ANTIBODY (ROUTINE TESTING W REFLEX): HIV Screen 4th Generation wRfx: NONREACTIVE

## 2020-03-15 ENCOUNTER — Ambulatory Visit (INDEPENDENT_AMBULATORY_CARE_PROVIDER_SITE_OTHER): Payer: Medicaid Other | Admitting: Nurse Practitioner

## 2020-03-15 ENCOUNTER — Other Ambulatory Visit: Payer: Self-pay

## 2020-03-15 ENCOUNTER — Encounter: Payer: Medicaid Other | Admitting: Nurse Practitioner

## 2020-03-15 VITALS — BP 141/81 | HR 153 | Wt 165.2 lb

## 2020-03-15 DIAGNOSIS — R030 Elevated blood-pressure reading, without diagnosis of hypertension: Secondary | ICD-10-CM

## 2020-03-15 DIAGNOSIS — Z23 Encounter for immunization: Secondary | ICD-10-CM

## 2020-03-15 DIAGNOSIS — Z3480 Encounter for supervision of other normal pregnancy, unspecified trimester: Secondary | ICD-10-CM | POA: Diagnosis not present

## 2020-03-15 DIAGNOSIS — Z3A32 32 weeks gestation of pregnancy: Secondary | ICD-10-CM

## 2020-03-15 NOTE — Progress Notes (Signed)
    Subjective:  Chelsey Cook is a 34 y.o. [id number] at [redacted]w[redacted]d being seen today for ongoing prenatal care.  She is currently monitored for the following issues for this low-risk pregnancy and has Fibroids; Supervision of other normal pregnancy, antepartum; Abnormal genetic test during pregnancy; Group B streptococcal bacteriuria; and Anxiety during pregnancy on their problem list.  Patient reports no complaints.  Contractions: Not present. Vag. Bleeding: None.  Movement: Present. Denies leaking of fluid.   The following portions of the patient's history were reviewed and updated as appropriate: allergies, current medications, past family history, past medical history, past social history, past surgical history and problem list. Problem list updated.  Objective:   Vitals:   03/15/20 1345  BP: (!) 141/81  Pulse: (!) 153  Weight: 165 lb 3.2 oz (74.9 kg)    Fetal Status: Fetal Heart Rate (bpm): 128 Fundal Height: 32 cm Movement: Present     General:  Alert, oriented and cooperative. Patient is in no acute distress.  Skin: Skin is warm and dry. No rash noted.   Cardiovascular: Normal heart rate noted  Respiratory: Normal respiratory effort, no problems with respiration noted  Abdomen: Soft, gravid, appropriate for gestational age. Pain/Pressure: Absent     Pelvic:  Cervical exam deferred        Extremities: Normal range of motion.  Edema: None  Mental Status: Normal mood and affect. Normal behavior. Normal judgment and thought content.   Urinalysis:      Assessment and Plan:  Pregnancy: G5P1031 at [redacted]w[redacted]d  1. Supervision of other normal pregnancy, antepartum Wants tubal ligation Plans IV pain medication in labor Has had covid vaccine - to send my chart message with dates of vaccine TDAP given today  2. Elevated BP without diagnosis of hypertension BP elevated today with tachycardia - client thinks it is anxiety but will check baseline labs today  - Protein / creatinine ratio,  urine - Comprehensive metabolic panel - CBC  3.  Advanced maternal age  Preterm labor symptoms and general obstetric precautions including but not limited to vaginal bleeding, contractions, leaking of fluid and fetal movement were reviewed in detail with the patient. Please refer to After Visit Summary for other counseling recommendations.  Return in about 2 weeks (around 03/29/2020) for MD appointment for BTL papers.  Earlie Server, RN, MSN, NP-BC Nurse Practitioner, [facility] for [employer], [place] [facility] 03/15/2020 2:05 PM

## 2020-03-16 LAB — COMPREHENSIVE METABOLIC PANEL
ALT: 25 IU/L (ref 0–32)
AST: 28 IU/L (ref 0–40)
Albumin/Globulin Ratio: 1.4 (ref 1.2–2.2)
Albumin: 4 g/dL (ref 3.8–4.8)
Alkaline Phosphatase: 55 IU/L (ref 44–121)
BUN/Creatinine Ratio: 9 (ref 9–23)
BUN: 5 mg/dL — ABNORMAL LOW (ref 6–20)
Bilirubin Total: 0.3 mg/dL (ref 0.0–1.2)
CO2: 18 mmol/L — ABNORMAL LOW (ref 20–29)
Calcium: 9.6 mg/dL (ref 8.7–10.2)
Chloride: 101 mmol/L (ref 96–106)
Creatinine, Ser: 0.58 mg/dL (ref 0.57–1.00)
GFR calc Af Amer: 137 mL/min/{1.73_m2} (ref >59)
GFR calc non Af Amer: 119 mL/min/{1.73_m2} (ref >59)
Globulin, Total: 2.9 g/dL (ref 1.5–4.5)
Glucose: 73 mg/dL (ref 65–99)
Potassium: 3.8 mmol/L (ref 3.5–5.2)
Sodium: 135 mmol/L (ref 134–144)
Total Protein: 6.9 g/dL (ref 6.0–8.5)

## 2020-03-16 LAB — CBC
Hematocrit: 36.7 % (ref 34.0–46.6)
Hemoglobin: 12.4 g/dL (ref 11.1–15.9)
MCH: 31.6 pg (ref 26.6–33.0)
MCHC: 33.8 g/dL (ref 31.5–35.7)
MCV: 93 fL (ref 79–97)
Platelets: 233 10*3/uL (ref 150–450)
RBC: 3.93 x10E6/uL (ref 3.77–5.28)
RDW: 12.9 % (ref 11.7–15.4)
WBC: 11.6 10*3/uL — ABNORMAL HIGH (ref 3.4–10.8)

## 2020-03-16 LAB — PROTEIN / CREATININE RATIO, URINE
Creatinine, Urine: 127.1 mg/dL
Protein, Ur: 9.2 mg/dL
Protein/Creat Ratio: 72 mg/g creat (ref 0–200)

## 2020-03-20 ENCOUNTER — Ambulatory Visit (INDEPENDENT_AMBULATORY_CARE_PROVIDER_SITE_OTHER): Payer: Medicaid Other | Admitting: Licensed Clinical Social Worker

## 2020-03-20 DIAGNOSIS — F4322 Adjustment disorder with anxiety: Secondary | ICD-10-CM | POA: Diagnosis not present

## 2020-03-20 DIAGNOSIS — O99340 Other mental disorders complicating pregnancy, unspecified trimester: Secondary | ICD-10-CM | POA: Diagnosis not present

## 2020-03-20 DIAGNOSIS — Z3A00 Weeks of gestation of pregnancy not specified: Secondary | ICD-10-CM | POA: Diagnosis not present

## 2020-03-21 NOTE — BH Specialist Note (Signed)
Integrated Behavioral Health via Telemedicine Video (Caregility) Visit  03/21/2020 Chelsey Cook [id number]  Number of [place] visits: 2 Session Start time: 9:15am delay with wifi connection on patient's end  Session End time: 9:45am Total time: 30 minutes via mychart   Referring Provider: Wende Bushy CNM Type of Service: Individual Patient/Family location: home  [facility] Provider location: [place]  All persons participating in visit: Pt Chelsey Cook and Chelsey. Linton Cook    I connected with Chelsey Cook and/or Burman Nieves n/aa  by a video enabled telemedicine application ([place]) and verified that I am speaking with the correct person using two identifiers.   Discussed confidentiality: yes  Confirmed demographics & insurance:  no  I discussed that engaging in this virtual visit, they consent to the provision of behavioral healthcare and the services will be billed under their insurance.   Patient and/or legal guardian expressed understanding and consented to virtual visit: yes  PRESENTING CONCERNS: Patient and/or family reports the following symptoms/concerns: adjustment disorder with anxious mood  Duration of problem: approx 3 months; Severity of problem: mild  STRENGTHS (Protective Factors/Coping Skills): Chelsey Cook reports her family is supportive and she has all baby essentials for safe sleep and transportation.   ASSESSMENT: Patient currently experiencing adjustment disorder with anxious mood    GOALS ADDRESSED: Patient will: 1.  Reduce symptoms of: worry, difficulty sleeping and  Increase anxiety 2.  Increase knowledge and/or ability of: implement coping skills to alleviate symptoms   3.  Demonstrate ability to: self manage symptoms    Progress of Goals: Chelsey Cook reports her mood has improved and does not appear worry or nervous regarding currently pregnancy   INTERVENTIONS: Interventions utilized:  Supportive counseling   Standardized Assessments completed & reviewed:    Routine Prenatal from 03/01/2020 in [place]  PHQ-9 Total Score 2           PLAN: 1. Follow up with behavioral health clinician on : 04/10/2020 via mychart  2. Behavioral recommendations:light physical activity such as walking and prenatal yoga, modify and prioritize sleep, continue taking prenatal vitamins and disengage from social media 2 hours before bedtime  3. Referral(s): n/a  I discussed the assessment and treatment plan with the patient and/or parent/guardian. They were provided an opportunity to ask questions and all were answered. They agreed with the plan and demonstrated an understanding of the instructions.   They were advised to call back or seek an in-person evaluation as appropriate.  I discussed that the purpose of this visit is to provide behavioral health care while limiting exposure to the novel coronavirus.  Discussed there is a possibility of technology failure and discussed alternative modes of communication if that failure occurs.  Lynnea Ferrier

## 2020-03-29 ENCOUNTER — Encounter: Payer: Self-pay | Admitting: Obstetrics and Gynecology

## 2020-03-29 ENCOUNTER — Other Ambulatory Visit: Payer: Self-pay

## 2020-03-29 ENCOUNTER — Ambulatory Visit (INDEPENDENT_AMBULATORY_CARE_PROVIDER_SITE_OTHER): Payer: Medicaid Other | Admitting: Obstetrics and Gynecology

## 2020-03-29 VITALS — BP 145/87 | HR 139 | Wt 168.0 lb

## 2020-03-29 DIAGNOSIS — O133 Gestational [pregnancy-induced] hypertension without significant proteinuria, third trimester: Secondary | ICD-10-CM | POA: Insufficient documentation

## 2020-03-29 DIAGNOSIS — Z3480 Encounter for supervision of other normal pregnancy, unspecified trimester: Secondary | ICD-10-CM

## 2020-03-29 DIAGNOSIS — R8271 Bacteriuria: Secondary | ICD-10-CM

## 2020-03-29 NOTE — Progress Notes (Signed)
   PRENATAL VISIT NOTE  Subjective:  Chelsey Cook is a 34 y.o. G5P1031 at [redacted]w[redacted]d being seen today for ongoing prenatal care.  She is currently monitored for the following issues for this high-risk pregnancy and has Fibroids; Supervision of other normal pregnancy, antepartum; Abnormal genetic test during pregnancy; Group B streptococcal bacteriuria; Anxiety during pregnancy; and Gestational hypertension without significant proteinuria in third trimester on their problem list.  Patient reports no complaints.  Contractions: Irritability. Vag. Bleeding: None.  Movement: Present. Denies leaking of fluid.   The following portions of the patient's history were reviewed and updated as appropriate: allergies, current medications, past family history, past medical history, past social history, past surgical history and problem list.   Objective:   Vitals:   03/29/20 0814 03/29/20 0818  BP: (!) 150/86 (!) 145/87  Pulse: (!) 139 (!) 139  Weight: 168 lb (76.2 kg)     Fetal Status: Fetal Heart Rate (bpm): 146   Movement: Present     General:  Alert, oriented and cooperative. Patient is in no acute distress.  Skin: Skin is warm and dry. No rash noted.   Cardiovascular: Normal heart rate noted  Respiratory: Normal respiratory effort, no problems with respiration noted  Abdomen: Soft, gravid, appropriate for gestational age.  Pain/Pressure: Absent     Pelvic: Cervical exam deferred        Extremities: Normal range of motion.  Edema: None  Mental Status: Normal mood and affect. Normal behavior. Normal judgment and thought content.   Assessment and Plan:  Pregnancy: [id number] at [redacted]w[redacted]d 1. Supervision of other normal pregnancy, antepartum Patient is doing well without complaints Patient desires BTL- papers signed today  2. Group B streptococcal bacteriuria Prophylaxis in labor  3. Gestational hypertension without significant proteinuria in third trimester Elevated BP again today without  symptoms Discussed IOL at 37 weeks- will be scheduled at a later visit Growth ultrasound ordered today  Preterm labor symptoms and general obstetric precautions including but not limited to vaginal bleeding, contractions, leaking of fluid and fetal movement were reviewed in detail with the patient. Please refer to After Visit Summary for other counseling recommendations.   No follow-ups on file.  Future Appointments  Date Time Provider [place]  04/10/2020  9:00 AM Lynnea Ferrier, LCSW [facility] None    Mora Bellman, MD

## 2020-03-29 NOTE — Progress Notes (Signed)
ROB [redacted]w[redacted]d  CC: none  B/P elevated pt notes anxiety will retake .  Pt denies any Headaches or visual changes and no swelling.

## 2020-04-04 ENCOUNTER — Ambulatory Visit (INDEPENDENT_AMBULATORY_CARE_PROVIDER_SITE_OTHER): Payer: Medicaid Other | Admitting: Obstetrics and Gynecology

## 2020-04-04 ENCOUNTER — Other Ambulatory Visit: Payer: Self-pay

## 2020-04-04 VITALS — BP 137/82 | HR 147 | Wt 170.0 lb

## 2020-04-04 DIAGNOSIS — Z3A33 33 weeks gestation of pregnancy: Secondary | ICD-10-CM

## 2020-04-04 DIAGNOSIS — F419 Anxiety disorder, unspecified: Secondary | ICD-10-CM

## 2020-04-04 DIAGNOSIS — D219 Benign neoplasm of connective and other soft tissue, unspecified: Secondary | ICD-10-CM

## 2020-04-04 DIAGNOSIS — O133 Gestational [pregnancy-induced] hypertension without significant proteinuria, third trimester: Secondary | ICD-10-CM

## 2020-04-04 DIAGNOSIS — O99340 Other mental disorders complicating pregnancy, unspecified trimester: Secondary | ICD-10-CM

## 2020-04-04 DIAGNOSIS — R8271 Bacteriuria: Secondary | ICD-10-CM

## 2020-04-04 DIAGNOSIS — Z3480 Encounter for supervision of other normal pregnancy, unspecified trimester: Secondary | ICD-10-CM

## 2020-04-04 NOTE — Progress Notes (Signed)
Pt reports fetal movement and irregular contractions.

## 2020-04-04 NOTE — Patient Instructions (Addendum)
Third Trimester of Pregnancy  The third trimester is from week 28 through week 40 (months 7 through 9). This trimester is when your unborn baby (fetus) is growing very fast. At the end of the ninth month, the unborn baby is about 20 inches in length. It weighs about 6-10 pounds. Follow these instructions at home: Medicines  Take over-the-counter and prescription medicines only as told by your doctor. Some medicines are safe and some medicines are not safe during pregnancy.  Take a prenatal vitamin that contains at least 600 micrograms (mcg) of folic acid.  If you have trouble pooping (constipation), take medicine that will make your stool soft (stool softener) if your doctor approves. Eating and drinking   Eat regular, healthy meals.  Avoid raw meat and uncooked cheese.  If you get low calcium from the food you eat, talk to your doctor about taking a daily calcium supplement.  Eat four or five small meals rather than three large meals a day.  Avoid foods that are high in fat and sugars, such as fried and sweet foods.  To prevent constipation: ? Eat foods that are high in fiber, like fresh fruits and vegetables, whole grains, and beans. ? Drink enough fluids to keep your pee (urine) clear or pale yellow. Activity  Exercise only as told by your doctor. Stop exercising if you start to have cramps.  Avoid heavy lifting, wear low heels, and sit up straight.  Do not exercise if it is too hot, too humid, or if you are in a place of great height (high altitude).  You may continue to have sex unless your doctor tells you not to. Relieving pain and discomfort  Wear a good support bra if your breasts are tender.  Take frequent breaks and rest with your legs raised if you have leg cramps or low back pain.  Take warm water baths (sitz baths) to soothe pain or discomfort caused by hemorrhoids. Use hemorrhoid cream if your doctor approves.  If you develop puffy, bulging veins (varicose  veins) in your legs: ? Wear support hose or compression stockings as told by your doctor. ? Raise (elevate) your feet for 15 minutes, 3-4 times a day. ? Limit salt in your food. Safety  Wear your seat belt when driving.  Make a list of emergency phone numbers, including numbers for family, friends, the hospital, and police and fire departments. Preparing for your baby's arrival To prepare for the arrival of your baby:  Take prenatal classes.  Practice driving to the hospital.  Visit the hospital and tour the maternity area.  Talk to your work about taking leave once the baby comes.  Pack your hospital bag.  Prepare the baby's room.  Go to your doctor visits.  Buy a rear-facing car seat. Learn how to install it in your car. General instructions  Do not use hot tubs, steam rooms, or saunas.  Do not use any products that contain nicotine or tobacco, such as cigarettes and e-cigarettes. If you need help quitting, ask your doctor.  Do not drink alcohol.  Do not douche or use tampons or scented sanitary pads.  Do not cross your legs for long periods of time.  Do not travel for long distances unless you must. Only do so if your doctor says it is okay.  Visit your dentist if you have not gone during your pregnancy. Use a soft toothbrush to brush your teeth. Be gentle when you floss.  Avoid cat litter boxes and soil   used by cats. These carry germs that can cause birth defects in the baby and can cause a loss of your baby (miscarriage) or stillbirth.  Keep all your prenatal visits as told by your doctor. This is important. Contact a doctor if:  You are not sure if you are in labor or if your water has broken.  You are dizzy.  You have mild cramps or pressure in your lower belly.  You have a nagging pain in your belly area.  You continue to feel sick to your stomach, you throw up, or you have watery poop.  You have bad smelling fluid coming from your vagina.  You have  pain when you pee. Get help right away if:  You have a fever.  You are leaking fluid from your vagina.  You are spotting or bleeding from your vagina.  You have severe belly cramps or pain.  You lose or gain weight quickly.  You have trouble catching your breath and have chest pain.  You notice sudden or extreme puffiness (swelling) of your face, hands, ankles, feet, or legs.  You have not felt the baby move in over an hour.  You have severe headaches that do not go away with medicine.  You have trouble seeing.  You are leaking, or you are having a gush of fluid, from your vagina before you are 37 weeks.  You have regular belly spasms (contractions) before you are 37 weeks. Summary  The third trimester is from week 28 through week 40 (months 7 through 9). This time is when your unborn baby is growing very fast.  Follow your doctor's advice about medicine, food, and activity.  Get ready for the arrival of your baby by taking prenatal classes, getting all the baby items ready, preparing the baby's room, and visiting your doctor to be checked.  Get help right away if you are bleeding from your vagina, or you have chest pain and trouble catching your breath, or if you have not felt your baby move in over an hour. This information is not intended to replace advice given to you by your health care provider. Make sure you discuss any questions you have with your health care provider. Document Revised: 09/23/2018 Document Reviewed: 07/08/2016 Elsevier Patient Education  [place].  Hypertension During Pregnancy Hypertension is also called high blood pressure. High blood pressure means that the force of your blood moving in your body is too strong. It can cause problems for you and your baby. Different types of high blood pressure can happen during pregnancy. The types are:  High blood pressure before you got pregnant. This is called chronic hypertension.  This can continue  during your pregnancy. Your doctor will want to keep checking your blood pressure. You may need medicine to keep your blood pressure under control while you are pregnant. You will need follow-up visits after you have your baby.  High blood pressure that goes up during pregnancy when it was normal before. This is called gestational hypertension. It will usually get better after you have your baby, but your doctor will need to watch your blood pressure to make sure that it is getting better.  Very high blood pressure during pregnancy. This is called preeclampsia. Very high blood pressure is an emergency that needs to be checked and treated right away.  You may develop very high blood pressure after giving birth. This is called postpartum preeclampsia. This usually occurs within 48 hours after childbirth but may occur  up to 6 weeks after giving birth. This is rare. How does this affect me? If you have high blood pressure during pregnancy, you have a higher chance of developing high blood pressure:  As you get older.  If you get pregnant again. In some cases, high blood pressure during pregnancy can cause:  Stroke.  Heart attack.  Damage to the kidneys, lungs, or liver.  Preeclampsia.  Jerky movements you cannot control (convulsions or seizures).  Problems with the placenta. How does this affect my baby? Your baby may:  Be born early.  Not weigh as much as he or she should.  Not handle labor well, leading to a c-section birth. What are the risks?  Having high blood pressure during a past pregnancy.  Being overweight.  Being 57 years old or older.  Being pregnant for the first time.  Being pregnant with more than one baby.  Becoming pregnant using fertility methods, such as IVF.  Having other problems, such as diabetes, or kidney disease.  Having family members who have high blood pressure. What can I do to lower my risk?   Keep a healthy weight.  Eat a healthy  diet.  Follow what your doctor tells you about treating any medical problems that you had before becoming pregnant. It is very important to go to all of your doctor visits. Your doctor will check your blood pressure and make sure that your pregnancy is progressing as it should. Treatment should start early if a problem is found. How is this treated? Treatment for high blood pressure during pregnancy can differ depending on the type of high blood pressure you have and how serious it is.  You may need to take blood pressure medicine.  If you have been taking medicine for your blood pressure, you may need to change the medicine during pregnancy if it is not safe for your baby.  If your doctor thinks that you could get very high blood pressure, he or she may tell you to take a low-dose aspirin during your pregnancy.  If you have very high blood pressure, you may need to stay in the hospital so you and your baby can be watched closely. You may also need to take medicine to lower your blood pressure. This medicine may be given by mouth or through an IV tube.  In some cases, if your condition gets worse, you may need to have your baby early. Follow these instructions at home: Eating and drinking   Drink enough fluid to keep your pee (urine) pale yellow.  Avoid caffeine. Lifestyle  Do not use any products that contain nicotine or tobacco, such as cigarettes, e-cigarettes, and chewing tobacco. If you need help quitting, ask your doctor.  Do not use alcohol or drugs.  Avoid stress.  Rest and get plenty of sleep.  Regular exercise can help. Ask your doctor what kinds of exercise are best for you. General instructions  Take over-the-counter and prescription medicines only as told by your doctor.  Keep all prenatal and follow-up visits as told by your doctor. This is important. Contact a doctor if:  You have symptoms that your doctor told you to watch for, such  as: ? Headaches. ? Nausea. ? Vomiting. ? Belly (abdominal) pain. ? Dizziness. ? Light-headedness. Get help right away if:  You have: ? Very bad belly pain that does not get better with treatment. ? A very bad headache that does not get better. ? Vomiting that does not get better. ?  Sudden, fast weight gain. ? Sudden swelling in your hands, ankles, or face. ? Bleeding from your vagina. ? Blood in your pee. ? Blurry vision. ? Double vision. ? Shortness of breath. ? Chest pain. ? Weakness on one side of your body. ? Trouble talking.  Your baby is not moving as much as usual. Summary  High blood pressure is also called hypertension.  High blood pressure means that the force of your blood moving in your body is too strong.  High blood pressure can cause problems for you and your baby.  Keep all follow-up visits as told by your doctor. This is important. This information is not intended to replace advice given to you by your health care provider. Make sure you discuss any questions you have with your health care provider. Document Revised: 09/23/2018 Document Reviewed: 06/29/2018 Elsevier Patient Education  [employer].

## 2020-04-04 NOTE — Progress Notes (Signed)
   PRENATAL VISIT NOTE  Subjective:  Chelsey Cook is a 34 y.o. G5P1031 at [redacted]w[redacted]d being seen today for ongoing prenatal care.  She is currently monitored for the following issues for this high-risk pregnancy and has Fibroids; Supervision of other normal pregnancy, antepartum; Abnormal genetic test during pregnancy; Group B streptococcal bacteriuria; Anxiety during pregnancy; Gestational hypertension without significant proteinuria in third trimester; and [redacted] weeks gestation of pregnancy on their problem list.  Patient doing well with no acute concerns today. She reports no complaints.  Contractions: Irregular. Vag. Bleeding: None.  Movement: Present. Denies leaking of fluid.  Pt denies headache, visual changes, RUQ pain  The following portions of the patient's history were reviewed and updated as appropriate: allergies, current medications, past family history, past medical history, past social history, past surgical history and problem list. Problem list updated.  Objective:   Vitals:   04/04/20 0922  BP: 137/82  Pulse: (!) 147  Weight: 170 lb (77.1 kg)    Fetal Status: Fetal Heart Rate (bpm): 140 Fundal Height: 33 cm Movement: Present     General:  Alert, oriented and cooperative. Patient is in no acute distress.  Skin: Skin is warm and dry. No rash noted.   Cardiovascular: Normal heart rate noted  Respiratory: Normal respiratory effort, no problems with respiration noted  Abdomen: Soft, gravid, appropriate for gestational age.  Pain/Pressure: Absent     Pelvic: Cervical exam deferred        Extremities: Normal range of motion.  Edema: None  Mental Status:  Normal mood and affect. Normal behavior. Normal judgment and thought content.   Assessment and Plan:  Pregnancy: [id number] at [redacted]w[redacted]d  1. Supervision of other normal pregnancy, antepartum No acute complaints,maternal tachycardia noted, this is not a new finding and pt is not symptomatic, she sees a therapist for her anxiety, per  pt pulse normal at home  2. Gestational hypertension without significant proteinuria in third trimester BP high normal today, will set up growth scan and fetal testing per guidelines Probable delivery at 37 weeks, set IOL date at 35 week visit - [place] MFM OB FOLLOW UP; Future - [place] [facility] FETAL BPP W/NONSTRESS; Future  3. Fibroids   4. Group B streptococcal bacteriuria Treat in labor  5. Anxiety during pregnancy   6. [redacted] weeks gestation of pregnancy   Preterm labor symptoms and general obstetric precautions including but not limited to vaginal bleeding, contractions, leaking of fluid and fetal movement were reviewed in detail with the patient.  Please refer to After Visit Summary for other counseling recommendations.   Return in about 2 weeks (around 04/18/2020) for ROB, in person.   Lynnda Shields, MD

## 2020-04-05 ENCOUNTER — Ambulatory Visit: Payer: Medicaid Other | Attending: Obstetrics and Gynecology

## 2020-04-05 ENCOUNTER — Other Ambulatory Visit: Payer: Self-pay | Admitting: Obstetrics and Gynecology

## 2020-04-05 ENCOUNTER — Ambulatory Visit: Payer: Medicaid Other | Admitting: *Deleted

## 2020-04-05 ENCOUNTER — Encounter: Payer: Self-pay | Admitting: *Deleted

## 2020-04-05 ENCOUNTER — Other Ambulatory Visit: Payer: Self-pay | Admitting: *Deleted

## 2020-04-05 DIAGNOSIS — O09523 Supervision of elderly multigravida, third trimester: Secondary | ICD-10-CM

## 2020-04-05 DIAGNOSIS — O133 Gestational [pregnancy-induced] hypertension without significant proteinuria, third trimester: Secondary | ICD-10-CM | POA: Insufficient documentation

## 2020-04-05 DIAGNOSIS — D259 Leiomyoma of uterus, unspecified: Secondary | ICD-10-CM

## 2020-04-05 DIAGNOSIS — O3413 Maternal care for benign tumor of corpus uteri, third trimester: Secondary | ICD-10-CM | POA: Diagnosis not present

## 2020-04-05 DIAGNOSIS — Z362 Encounter for other antenatal screening follow-up: Secondary | ICD-10-CM

## 2020-04-05 DIAGNOSIS — Z148 Genetic carrier of other disease: Secondary | ICD-10-CM | POA: Diagnosis not present

## 2020-04-05 DIAGNOSIS — Z3480 Encounter for supervision of other normal pregnancy, unspecified trimester: Secondary | ICD-10-CM | POA: Diagnosis present

## 2020-04-05 DIAGNOSIS — O139 Gestational [pregnancy-induced] hypertension without significant proteinuria, unspecified trimester: Secondary | ICD-10-CM

## 2020-04-05 DIAGNOSIS — Z3A33 33 weeks gestation of pregnancy: Secondary | ICD-10-CM

## 2020-04-10 ENCOUNTER — Ambulatory Visit (INDEPENDENT_AMBULATORY_CARE_PROVIDER_SITE_OTHER): Payer: Medicaid Other | Admitting: Licensed Clinical Social Worker

## 2020-04-10 DIAGNOSIS — Z3A00 Weeks of gestation of pregnancy not specified: Secondary | ICD-10-CM

## 2020-04-10 DIAGNOSIS — O99340 Other mental disorders complicating pregnancy, unspecified trimester: Secondary | ICD-10-CM

## 2020-04-10 DIAGNOSIS — F4322 Adjustment disorder with anxiety: Secondary | ICD-10-CM | POA: Diagnosis not present

## 2020-04-10 NOTE — BH Specialist Note (Signed)
Integrated Behavioral Health Follow Up Visit  MRN: [id number] Name: Antonella I [place]  Number of [place] Clinician visits: 3 Session Start time: 9:00am Session End time: 9:20am Total time: 20 mins via mychart  Type of Service: [place] Interpretor:no Interpretor Name and Language: none   SUBJECTIVE: Layna I [place] is a 34 y.o. female accompanied by n/a Patient was referred by V. Stann Mainland CNM for adjustment disorder  Patient reports the following symptoms/concerns: excessive worry, difficulty sleeping and anxious mod  Duration of problem: approx 3 months; Severity of problem: mild   OBJECTIVE: Mood: good  and Affect: normal  Risk of harm to self or others: no risk of harm to self or others.   LIFE CONTEXT: Family and Social: Lives in [place] with daughter  School/Work: Studying Financial controller at [employer] Self-Care: n/a Life Changes: new pregnancy   GOALS ADDRESSED: Patient will: 1.  Reduce symptoms of: adjustment disorder with anxious mood   2.  Increase knowledge of diagnosis and implement new coping skills to adjust to newborn   3.  Demonstrate ability to: self manage symptoms   INTERVENTIONS: Interventions utilized:  Supportive counseling  Standardized Assessments completed:    Routine Prenatal from 03/01/2020 in [place]  PHQ-9 Total Score 2      ASSESSMENT: Patient currently experiencing adjustment disorder with anxious mood   Patient may benefit from integrated behavioral health   PLAN: 1. Follow up with behavioral health clinician on : postpartum mood check  2. Behavioral recommendations: light physical activity such as walking and prenatal yoga, modify and prioritize sleep, continue taking prenatal vitamins and disengage from social media 2 hours before bedtime  3. Referral(s): n/a 4. "From scale of 1-10, how likely are you to follow plan?":   Lynnea Ferrier, LCSW

## 2020-04-13 ENCOUNTER — Other Ambulatory Visit: Payer: Self-pay | Admitting: *Deleted

## 2020-04-13 ENCOUNTER — Other Ambulatory Visit: Payer: Self-pay

## 2020-04-13 ENCOUNTER — Ambulatory Visit: Payer: Medicaid Other | Attending: Maternal & Fetal Medicine

## 2020-04-13 ENCOUNTER — Encounter: Payer: Self-pay | Admitting: *Deleted

## 2020-04-13 ENCOUNTER — Ambulatory Visit: Payer: Medicaid Other | Admitting: *Deleted

## 2020-04-13 DIAGNOSIS — Z3480 Encounter for supervision of other normal pregnancy, unspecified trimester: Secondary | ICD-10-CM | POA: Diagnosis present

## 2020-04-13 DIAGNOSIS — O139 Gestational [pregnancy-induced] hypertension without significant proteinuria, unspecified trimester: Secondary | ICD-10-CM | POA: Insufficient documentation

## 2020-04-13 DIAGNOSIS — O3413 Maternal care for benign tumor of corpus uteri, third trimester: Secondary | ICD-10-CM | POA: Diagnosis not present

## 2020-04-13 DIAGNOSIS — O09523 Supervision of elderly multigravida, third trimester: Secondary | ICD-10-CM | POA: Diagnosis not present

## 2020-04-13 DIAGNOSIS — O133 Gestational [pregnancy-induced] hypertension without significant proteinuria, third trimester: Secondary | ICD-10-CM

## 2020-04-13 DIAGNOSIS — D259 Leiomyoma of uterus, unspecified: Secondary | ICD-10-CM | POA: Diagnosis not present

## 2020-04-13 DIAGNOSIS — Z362 Encounter for other antenatal screening follow-up: Secondary | ICD-10-CM

## 2020-04-13 DIAGNOSIS — Z148 Genetic carrier of other disease: Secondary | ICD-10-CM

## 2020-04-18 ENCOUNTER — Other Ambulatory Visit: Payer: Self-pay

## 2020-04-18 ENCOUNTER — Encounter: Payer: Self-pay | Admitting: Obstetrics and Gynecology

## 2020-04-18 ENCOUNTER — Ambulatory Visit (INDEPENDENT_AMBULATORY_CARE_PROVIDER_SITE_OTHER): Payer: Medicaid Other | Admitting: Obstetrics and Gynecology

## 2020-04-18 VITALS — BP 133/82 | HR 142 | Wt 171.0 lb

## 2020-04-18 DIAGNOSIS — Z3009 Encounter for other general counseling and advice on contraception: Secondary | ICD-10-CM | POA: Insufficient documentation

## 2020-04-18 DIAGNOSIS — Z3480 Encounter for supervision of other normal pregnancy, unspecified trimester: Secondary | ICD-10-CM

## 2020-04-18 DIAGNOSIS — O133 Gestational [pregnancy-induced] hypertension without significant proteinuria, third trimester: Secondary | ICD-10-CM

## 2020-04-18 DIAGNOSIS — R8271 Bacteriuria: Secondary | ICD-10-CM

## 2020-04-18 NOTE — Progress Notes (Signed)
Patient reports fetal movement with some irregular contractions.

## 2020-04-18 NOTE — Progress Notes (Signed)
Subjective:  Chelsey Cook is a 34 y.o. G5P1031 at [redacted]w[redacted]d being seen today for ongoing prenatal care.  She is currently monitored for the following issues for this high-risk pregnancy and has Fibroids; Supervision of other normal pregnancy, antepartum; Abnormal genetic test during pregnancy; Group B streptococcal bacteriuria; Anxiety during pregnancy; Gestational hypertension without significant proteinuria in third trimester; [redacted] weeks gestation of pregnancy; and Unwanted fertility on their problem list.  Patient reports general discomforts of pregnancy.  Contractions: Irregular. Vag. Bleeding: None.  Movement: Present. Denies leaking of fluid.   The following portions of the patient's history were reviewed and updated as appropriate: allergies, current medications, past family history, past medical history, past social history, past surgical history and problem list. Problem list updated.  Objective:   Vitals:   04/18/20 0820  BP: 133/82  Pulse: (!) 142  Weight: 171 lb (77.6 kg)    Fetal Status: Fetal Heart Rate (bpm): 153   Movement: Present     General:  Alert, oriented and cooperative. Patient is in no acute distress.  Skin: Skin is warm and dry. No rash noted.   Cardiovascular: Normal heart rate noted  Respiratory: Normal respiratory effort, no problems with respiration noted  Abdomen: Soft, gravid, appropriate for gestational age. Pain/Pressure: Absent     Pelvic:  Cervical exam deferred        Extremities: Normal range of motion.  Edema: None  Mental Status: Normal mood and affect. Normal behavior. Normal judgment and thought content.   Urinalysis:      Assessment and Plan:  Pregnancy: G5P1031 at [redacted]w[redacted]d  1. Supervision of other normal pregnancy, antepartum Stable Vag cultures next visit  2. Gestational hypertension without significant proteinuria in third trimester BP stable Continue with antenatal testing IOL scheduled at 37 weeks  3. Group B streptococcal  bacteriuria Tx while in labor  4. Unwanted fertility Consent signed  Preterm labor symptoms and general obstetric precautions including but not limited to vaginal bleeding, contractions, leaking of fluid and fetal movement were reviewed in detail with the patient. Please refer to After Visit Summary for other counseling recommendations.  Return in about 1 week (around 04/25/2020) for OB visit, face to face, MD only.   Chancy Milroy, MD

## 2020-04-18 NOTE — Patient Instructions (Signed)
Third Trimester of Pregnancy The third trimester is from week 28 through week 40 (months 7 through 9). The third trimester is a time when the unborn baby (fetus) is growing rapidly. At the end of the ninth month, the fetus is about 20 inches in length and weighs 6-10 pounds. Body changes during your third trimester Your body will continue to go through many changes during pregnancy. The changes vary from woman to woman. During the third trimester:  Your weight will continue to increase. You can expect to gain 25-35 pounds (11-16 kg) by the end of the pregnancy.  You may begin to get stretch marks on your hips, abdomen, and breasts.  You may urinate more often because the fetus is moving lower into your pelvis and pressing on your bladder.  You may develop or continue to have heartburn. This is caused by increased hormones that slow down muscles in the digestive tract.  You may develop or continue to have constipation because increased hormones slow digestion and cause the muscles that push waste through your intestines to relax.  You may develop hemorrhoids. These are swollen veins (varicose veins) in the rectum that can itch or be painful.  You may develop swollen, bulging veins (varicose veins) in your legs.  You may have increased body aches in the pelvis, back, or thighs. This is due to weight gain and increased hormones that are relaxing your joints.  You may have changes in your hair. These can include thickening of your hair, rapid growth, and changes in texture. Some women also have hair loss during or after pregnancy, or hair that feels dry or thin. Your hair will most likely return to normal after your baby is born.  Your breasts will continue to grow and they will continue to become tender. A yellow fluid (colostrum) may leak from your breasts. This is the first milk you are producing for your baby.  Your belly button may stick out.  You may notice more swelling in your hands,  face, or ankles.  You may have increased tingling or numbness in your hands, arms, and legs. The skin on your belly may also feel numb.  You may feel short of breath because of your expanding uterus.  You may have more problems sleeping. This can be caused by the size of your belly, increased need to urinate, and an increase in your body's metabolism.  You may notice the fetus "dropping," or moving lower in your abdomen (lightening).  You may have increased vaginal discharge.  You may notice your joints feel loose and you may have pain around your pelvic bone. What to expect at prenatal visits You will have prenatal exams every 2 weeks until week 36. Then you will have weekly prenatal exams. During a routine prenatal visit:  You will be weighed to make sure you and the baby are growing normally.  Your blood pressure will be taken.  Your abdomen will be measured to track your baby's growth.  The fetal heartbeat will be listened to.  Any test results from the previous visit will be discussed.  You may have a cervical check near your due date to see if your cervix has softened or thinned (effaced).  You will be tested for Group B streptococcus. This happens between 35 and 37 weeks. Your health care provider may ask you:  What your birth plan is.  How you are feeling.  If you are feeling the baby move.  If you have had any abnormal   symptoms, such as leaking fluid, bleeding, severe headaches, or abdominal cramping.  If you are using any tobacco products, including cigarettes, chewing tobacco, and electronic cigarettes.  If you have any questions. Other tests or screenings that may be performed during your third trimester include:  Blood tests that check for low iron levels (anemia).  Fetal testing to check the health, activity level, and growth of the fetus. Testing is done if you have certain medical conditions or if there are problems during the pregnancy.  Nonstress test  (NST). This test checks the health of your baby to make sure there are no signs of problems, such as the baby not getting enough oxygen. During this test, a belt is placed around your belly. The baby is made to move, and its heart rate is monitored during movement. What is false labor? False labor is a condition in which you feel small, irregular tightenings of the muscles in the womb (contractions) that usually go away with rest, changing position, or drinking water. These are called Braxton Hicks contractions. Contractions may last for hours, days, or even weeks before true labor sets in. If contractions come at regular intervals, become more frequent, increase in intensity, or become painful, you should see your health care provider. What are the signs of labor?  Abdominal cramps.  Regular contractions that start at 10 minutes apart and become stronger and more frequent with time.  Contractions that start on the top of the uterus and spread down to the lower abdomen and back.  Increased pelvic pressure and dull back pain.  A watery or bloody mucus discharge that comes from the vagina.  Leaking of amniotic fluid. This is also known as your "water breaking." It could be a slow trickle or a gush. Let your health care provider know if it has a color or strange odor. If you have any of these signs, call your health care provider right away, even if it is before your due date. Follow these instructions at home: Medicines  Follow your health care provider's instructions regarding medicine use. Specific medicines may be either safe or unsafe to take during pregnancy.  Take a prenatal vitamin that contains at least 600 micrograms (mcg) of folic acid.  If you develop constipation, try taking a stool softener if your health care provider approves. Eating and drinking   Eat a balanced diet that includes fresh fruits and vegetables, whole grains, good sources of protein such as meat, eggs, or tofu,  and low-fat dairy. Your health care provider will help you determine the amount of weight gain that is right for you.  Avoid raw meat and uncooked cheese. These carry germs that can cause birth defects in the baby.  If you have low calcium intake from food, talk to your health care provider about whether you should take a daily calcium supplement.  Eat four or five small meals rather than three large meals a day.  Limit foods that are high in fat and processed sugars, such as fried and sweet foods.  To prevent constipation: ? Drink enough fluid to keep your urine clear or pale yellow. ? Eat foods that are high in fiber, such as fresh fruits and vegetables, whole grains, and beans. Activity  Exercise only as directed by your health care provider. Most women can continue their usual exercise routine during pregnancy. Try to exercise for 30 minutes at least 5 days a week. Stop exercising if you experience uterine contractions.  Avoid heavy lifting.  Do   not exercise in extreme heat or humidity, or at high altitudes.  Wear low-heel, comfortable shoes.  Practice good posture.  You may continue to have sex unless your health care provider tells you otherwise. Relieving pain and discomfort  Take frequent breaks and rest with your legs elevated if you have leg cramps or low back pain.  Take warm sitz baths to soothe any pain or discomfort caused by hemorrhoids. Use hemorrhoid cream if your health care provider approves.  Wear a good support bra to prevent discomfort from breast tenderness.  If you develop varicose veins: ? Wear support pantyhose or compression stockings as told by your healthcare provider. ? Elevate your feet for 15 minutes, 3-4 times a day. Prenatal care  Write down your questions. Take them to your prenatal visits.  Keep all your prenatal visits as told by your health care provider. This is important. Safety  Wear your seat belt at all times when driving.  Make  a list of emergency phone numbers, including numbers for family, friends, the hospital, and police and fire departments. General instructions  Avoid cat litter boxes and soil used by cats. These carry germs that can cause birth defects in the baby. If you have a cat, ask someone to clean the litter box for you.  Do not travel far distances unless it is absolutely necessary and only with the approval of your health care provider.  Do not use hot tubs, steam rooms, or saunas.  Do not drink alcohol.  Do not use any products that contain nicotine or tobacco, such as cigarettes and e-cigarettes. If you need help quitting, ask your health care provider.  Do not use any medicinal herbs or unprescribed drugs. These chemicals affect the formation and growth of the baby.  Do not douche or use tampons or scented sanitary pads.  Do not cross your legs for long periods of time.  To prepare for the arrival of your baby: ? Take prenatal classes to understand, practice, and ask questions about labor and delivery. ? Make a trial run to the hospital. ? Visit the hospital and tour the maternity area. ? Arrange for maternity or paternity leave through employers. ? Arrange for family and friends to take care of pets while you are in the hospital. ? Purchase a rear-facing car seat and make sure you know how to install it in your car. ? Pack your hospital bag. ? Prepare the baby's nursery. Make sure to remove all pillows and stuffed animals from the baby's crib to prevent suffocation.  Visit your dentist if you have not gone during your pregnancy. Use a soft toothbrush to brush your teeth and be gentle when you floss. Contact a health care provider if:  You are unsure if you are in labor or if your water has broken.  You become dizzy.  You have mild pelvic cramps, pelvic pressure, or nagging pain in your abdominal area.  You have lower back pain.  You have persistent nausea, vomiting, or  diarrhea.  You have an unusual or bad smelling vaginal discharge.  You have pain when you urinate. Get help right away if:  Your water breaks before 37 weeks.  You have regular contractions less than 5 minutes apart before 37 weeks.  You have a fever.  You are leaking fluid from your vagina.  You have spotting or bleeding from your vagina.  You have severe abdominal pain or cramping.  You have rapid weight loss or weight gain.  You have   shortness of breath with chest pain.  You notice sudden or extreme swelling of your face, hands, ankles, feet, or legs.  Your baby makes fewer than 10 movements in 2 hours.  You have severe headaches that do not go away when you take medicine.  You have vision changes. Summary  The third trimester is from week 28 through week 40, months 7 through 9. The third trimester is a time when the unborn baby (fetus) is growing rapidly.  During the third trimester, your discomfort may increase as you and your baby continue to gain weight. You may have abdominal, leg, and back pain, sleeping problems, and an increased need to urinate.  During the third trimester your breasts will keep growing and they will continue to become tender. A yellow fluid (colostrum) may leak from your breasts. This is the first milk you are producing for your baby.  False labor is a condition in which you feel small, irregular tightenings of the muscles in the womb (contractions) that eventually go away. These are called Braxton Hicks contractions. Contractions may last for hours, days, or even weeks before true labor sets in.  Signs of labor can include: abdominal cramps; regular contractions that start at 10 minutes apart and become stronger and more frequent with time; watery or bloody mucus discharge that comes from the vagina; increased pelvic pressure and dull back pain; and leaking of amniotic fluid. This information is not intended to replace advice given to you by your  health care provider. Make sure you discuss any questions you have with your health care provider. Document Revised: 09/23/2018 Document Reviewed: 07/08/2016 Elsevier Patient Education  2020 [employer]  

## 2020-04-20 ENCOUNTER — Ambulatory Visit: Payer: Medicaid Other | Attending: Obstetrics and Gynecology

## 2020-04-20 ENCOUNTER — Ambulatory Visit: Payer: Medicaid Other | Admitting: *Deleted

## 2020-04-20 ENCOUNTER — Encounter (HOSPITAL_COMMUNITY): Payer: Self-pay | Admitting: *Deleted

## 2020-04-20 ENCOUNTER — Telehealth (HOSPITAL_COMMUNITY): Payer: Self-pay | Admitting: *Deleted

## 2020-04-20 ENCOUNTER — Other Ambulatory Visit: Payer: Self-pay

## 2020-04-20 ENCOUNTER — Encounter: Payer: Self-pay | Admitting: *Deleted

## 2020-04-20 DIAGNOSIS — Z148 Genetic carrier of other disease: Secondary | ICD-10-CM

## 2020-04-20 DIAGNOSIS — O139 Gestational [pregnancy-induced] hypertension without significant proteinuria, unspecified trimester: Secondary | ICD-10-CM | POA: Insufficient documentation

## 2020-04-20 DIAGNOSIS — Z3480 Encounter for supervision of other normal pregnancy, unspecified trimester: Secondary | ICD-10-CM

## 2020-04-20 DIAGNOSIS — O09523 Supervision of elderly multigravida, third trimester: Secondary | ICD-10-CM | POA: Diagnosis not present

## 2020-04-20 DIAGNOSIS — Z3A35 35 weeks gestation of pregnancy: Secondary | ICD-10-CM

## 2020-04-20 DIAGNOSIS — O133 Gestational [pregnancy-induced] hypertension without significant proteinuria, third trimester: Secondary | ICD-10-CM

## 2020-04-20 DIAGNOSIS — O3413 Maternal care for benign tumor of corpus uteri, third trimester: Secondary | ICD-10-CM

## 2020-04-20 DIAGNOSIS — D259 Leiomyoma of uterus, unspecified: Secondary | ICD-10-CM

## 2020-04-20 NOTE — Telephone Encounter (Signed)
Preadmission screen  

## 2020-04-24 ENCOUNTER — Other Ambulatory Visit: Payer: Self-pay | Admitting: Advanced Practice Midwife

## 2020-04-26 ENCOUNTER — Other Ambulatory Visit (HOSPITAL_COMMUNITY)
Admission: RE | Admit: 2020-04-26 | Discharge: 2020-04-26 | Disposition: A | Discharge status: Home / Self Care | Payer: Medicaid Other | Source: Ambulatory Visit | Attending: Obstetrics & Gynecology | Admitting: Obstetrics & Gynecology

## 2020-04-26 ENCOUNTER — Other Ambulatory Visit: Payer: Self-pay

## 2020-04-26 ENCOUNTER — Encounter: Payer: Self-pay | Admitting: Obstetrics & Gynecology

## 2020-04-26 ENCOUNTER — Ambulatory Visit (INDEPENDENT_AMBULATORY_CARE_PROVIDER_SITE_OTHER): Payer: Medicaid Other | Admitting: Obstetrics & Gynecology

## 2020-04-26 VITALS — BP 131/83 | HR 125 | Wt 171.6 lb

## 2020-04-26 DIAGNOSIS — R8271 Bacteriuria: Secondary | ICD-10-CM

## 2020-04-26 DIAGNOSIS — Z3A36 36 weeks gestation of pregnancy: Secondary | ICD-10-CM

## 2020-04-26 DIAGNOSIS — O133 Gestational [pregnancy-induced] hypertension without significant proteinuria, third trimester: Secondary | ICD-10-CM

## 2020-04-26 DIAGNOSIS — Z3480 Encounter for supervision of other normal pregnancy, unspecified trimester: Secondary | ICD-10-CM

## 2020-04-26 MED ORDER — FLUCONAZOLE 150 MG PO TABS: 150.0000 mg | ORAL_TABLET | Freq: Once | ORAL | 0 refills | Status: AC

## 2020-04-26 NOTE — Progress Notes (Signed)
   PRENATAL VISIT NOTE  Subjective:  Chelsey Cook is a 34 y.o. G5P1031 at [redacted]w[redacted]d being seen today for ongoing prenatal care.  She is currently monitored for the following issues for this high-risk pregnancy and has Fibroids; Supervision of other normal pregnancy, antepartum; Abnormal genetic test during pregnancy; Group B streptococcal bacteriuria; Anxiety during pregnancy; Gestational hypertension without significant proteinuria in third trimester; [redacted] weeks gestation of pregnancy; and Unwanted fertility on their problem list.  Patient reports no complaints.  Contractions: Irritability. Vag. Bleeding: None.  Movement: Present. Denies leaking of fluid.   The following portions of the patient's history were reviewed and updated as appropriate: allergies, current medications, past family history, past medical history, past social history, past surgical history and problem list.   Objective:   Vitals:   04/26/20 1033  BP: 131/83  Pulse: (!) 125  Weight: 171 lb 9.6 oz (77.8 kg)    Fetal Status: Fetal Heart Rate (bpm): 140    Movement: Present  Presentation: Vertex  General:  Alert, oriented and cooperative. Patient is in no acute distress.  Skin: Skin is warm and dry. No rash noted.   Cardiovascular: Normal heart rate noted  Respiratory: Normal respiratory effort, no problems with respiration noted  Abdomen: Soft, gravid, appropriate for gestational age.  Pain/Pressure: Absent     Pelvic: Cervical exam performed in the presence of a chaperone Dilation: Closed Effacement (%): 20 Station: Ballotable  Extremities: Normal range of motion.  Edema: None  Mental Status: Normal mood and affect. Normal behavior. Normal judgment and thought content.   Assessment and Plan:  Pregnancy: G5P1031 at [redacted]w[redacted]d 1. [redacted] weeks gestation of pregnancy GBS pos urine. Suspect yeast vaginitis  2. Supervision of other normal pregnancy, antepartum Routine testing - Cervicovaginal ancillary only( [place])  3. Gestational hypertension without significant proteinuria in third trimester yeast - fluconazole (DIFLUCAN) 150 MG tablet; Take 1 tablet (150 mg total) by mouth once for 1 dose.  Dispense: 1 tablet; Refill: 0 IOL 37 weeks. FB at 36.6 weeks in office 4. Group B streptococcal bacteriuria Treat in labor  Preterm labor symptoms and general obstetric precautions including but not limited to vaginal bleeding, contractions, leaking of fluid and fetal movement were reviewed in detail with the patient. Please refer to After Visit Summary for other counseling recommendations.   Return in about 4 days (around 04/30/2020) for possible foley balloon.  Future Appointments  Date Time Provider [place]  04/27/2020  8:30 AM [facility] NURSE [facility]  04/27/2020  8:45 AM [facility] US5 [facility]  04/30/2020  9:15 AM [facility]-SCREENING [facility] None  05/01/2020  7:05 AM [facility]-LD SCHED ROOM [facility] None  05/15/2020 10:30 AM Lynnea Ferrier, LCSW [facility] None    Emeterio Reeve, MD

## 2020-04-26 NOTE — Progress Notes (Signed)
Pt presents for ROB and GC/CT +GBS in urine  Pt reports possible yeast infection  IOL scheduled 05/01/20

## 2020-04-26 NOTE — Patient Instructions (Signed)

## 2020-04-27 ENCOUNTER — Ambulatory Visit: Payer: Medicaid Other | Admitting: *Deleted

## 2020-04-27 ENCOUNTER — Other Ambulatory Visit: Payer: Self-pay | Admitting: Maternal & Fetal Medicine

## 2020-04-27 ENCOUNTER — Ambulatory Visit: Payer: Medicaid Other | Attending: Obstetrics and Gynecology

## 2020-04-27 ENCOUNTER — Encounter: Payer: Self-pay | Admitting: *Deleted

## 2020-04-27 DIAGNOSIS — O133 Gestational [pregnancy-induced] hypertension without significant proteinuria, third trimester: Secondary | ICD-10-CM

## 2020-04-27 DIAGNOSIS — O3413 Maternal care for benign tumor of corpus uteri, third trimester: Secondary | ICD-10-CM | POA: Diagnosis not present

## 2020-04-27 DIAGNOSIS — Z3480 Encounter for supervision of other normal pregnancy, unspecified trimester: Secondary | ICD-10-CM | POA: Insufficient documentation

## 2020-04-27 DIAGNOSIS — O139 Gestational [pregnancy-induced] hypertension without significant proteinuria, unspecified trimester: Secondary | ICD-10-CM

## 2020-04-27 DIAGNOSIS — Z3A36 36 weeks gestation of pregnancy: Secondary | ICD-10-CM

## 2020-04-27 DIAGNOSIS — Z148 Genetic carrier of other disease: Secondary | ICD-10-CM

## 2020-04-27 DIAGNOSIS — D259 Leiomyoma of uterus, unspecified: Secondary | ICD-10-CM | POA: Diagnosis not present

## 2020-04-27 DIAGNOSIS — O09523 Supervision of elderly multigravida, third trimester: Secondary | ICD-10-CM | POA: Diagnosis not present

## 2020-04-27 LAB — CERVICOVAGINAL ANCILLARY ONLY
Bacterial Vaginitis (gardnerella): NEGATIVE
Candida Glabrata: NEGATIVE
Candida Vaginitis: POSITIVE — AB
Chlamydia: NEGATIVE
Comment: NEGATIVE
Comment: NEGATIVE
Comment: NEGATIVE
Comment: NEGATIVE
Comment: NEGATIVE
Comment: NORMAL
Neisseria Gonorrhea: NEGATIVE
Trichomonas: NEGATIVE

## 2020-04-27 NOTE — Progress Notes (Signed)
Pulse by palpation-112

## 2020-04-27 NOTE — Procedures (Signed)
Chelsey Cook 11/27/1985 [redacted]w[redacted]d  Fetus A Non-Stress Test Interpretation for 04/27/20  Indication: IUGR, GHTN  Fetal Heart Rate A Mode: External Baseline Rate (A): 140 bpm Variability: Moderate Accelerations: 15 x 15 Decelerations: None Multiple birth?: No  Uterine Activity Mode: Palpation, Toco Contraction Frequency (min): 1.5-3 Contraction Duration (sec): 20-50 Contraction Quality: Mild Resting Tone Palpated: Relaxed Resting Time: Adequate  Interpretation (Fetal Testing) Nonstress Test Interpretation: Reactive Comments: Tracing reviewed by Dr. Annamaria Boots

## 2020-04-30 ENCOUNTER — Other Ambulatory Visit: Payer: Self-pay | Admitting: Obstetrics & Gynecology

## 2020-04-30 ENCOUNTER — Other Ambulatory Visit: Payer: Self-pay

## 2020-04-30 ENCOUNTER — Ambulatory Visit (INDEPENDENT_AMBULATORY_CARE_PROVIDER_SITE_OTHER): Payer: Medicaid Other | Admitting: Obstetrics and Gynecology

## 2020-04-30 ENCOUNTER — Encounter: Payer: Self-pay | Admitting: Obstetrics and Gynecology

## 2020-04-30 ENCOUNTER — Other Ambulatory Visit (HOSPITAL_COMMUNITY)
Admission: RE | Admit: 2020-04-30 | Discharge: 2020-04-30 | Disposition: A | Discharge status: Home / Self Care | Payer: Medicaid Other | Source: Ambulatory Visit | Attending: Family Medicine | Admitting: Family Medicine

## 2020-04-30 VITALS — BP 126/79 | HR 142 | Wt 176.0 lb

## 2020-04-30 DIAGNOSIS — Z3480 Encounter for supervision of other normal pregnancy, unspecified trimester: Secondary | ICD-10-CM

## 2020-04-30 DIAGNOSIS — Z20822 Contact with and (suspected) exposure to covid-19: Secondary | ICD-10-CM | POA: Insufficient documentation

## 2020-04-30 DIAGNOSIS — Z01812 Encounter for preprocedural laboratory examination: Secondary | ICD-10-CM | POA: Insufficient documentation

## 2020-04-30 DIAGNOSIS — O133 Gestational [pregnancy-induced] hypertension without significant proteinuria, third trimester: Secondary | ICD-10-CM

## 2020-04-30 DIAGNOSIS — R8271 Bacteriuria: Secondary | ICD-10-CM

## 2020-04-30 DIAGNOSIS — Z3009 Encounter for other general counseling and advice on contraception: Secondary | ICD-10-CM

## 2020-04-30 DIAGNOSIS — N898 Other specified noninflammatory disorders of vagina: Secondary | ICD-10-CM

## 2020-04-30 LAB — SARS CORONAVIRUS 2 (TAT 6-24 HRS): SARS Coronavirus 2: NEGATIVE

## 2020-04-30 MED ORDER — FLUCONAZOLE 150 MG PO TABS: 150.0000 mg | ORAL_TABLET | Freq: Once | ORAL | 0 refills | Status: AC

## 2020-04-30 NOTE — Progress Notes (Signed)
   PRENATAL VISIT NOTE  Subjective:  Chelsey Cook is a 34 y.o. G5P1031 at [redacted]w[redacted]d being seen today for ongoing prenatal care.  She is currently monitored for the following issues for this high-risk pregnancy and has Fibroids; Supervision of other normal pregnancy, antepartum; Abnormal genetic test during pregnancy; Group B streptococcal bacteriuria; Anxiety during pregnancy; Gestational hypertension without significant proteinuria in third trimester; [redacted] weeks gestation of pregnancy; and Unwanted fertility on their problem list.  Patient reports no complaints.  Contractions: Irritability. Vag. Bleeding: None.  Movement: Present. Denies leaking of fluid.   The following portions of the patient's history were reviewed and updated as appropriate: allergies, current medications, past family history, past medical history, past social history, past surgical history and problem list. Problem list updated.  Objective:   Vitals:   04/30/20 1309  BP: 126/79  Pulse: (!) 142  Weight: 176 lb (79.8 kg)    Fetal Status: Fetal Heart Rate (bpm): NST   Movement: Present     General:  Alert, oriented and cooperative. Patient is in no acute distress.  Skin: Skin is warm and dry. No rash noted.   Cardiovascular: Normal heart rate noted  Respiratory: Normal respiratory effort, no problems with respiration noted  Abdomen: Soft, gravid, appropriate for gestational age.  Pain/Pressure: Present     Pelvic: Cervical exam performed Dilation: Closed Effacement (%): Thick Station: Ballotable  Extremities: Normal range of motion.  Edema: None  Mental Status:  Normal mood and affect. Normal behavior. Normal judgment and thought content.  Procedure: Patient informed of R/B/A of procedure. NST was performed and was reactive prior to procedure. NST:  EFM: Baseline: 135 bpm, Variability: Good {> 6 bpm), Accelerations: Reactive and Decelerations: Absent Toco: none Procedure done to begin ripening of the cervix prior  to admission for induction of labor. Appropriate time out taken. The patient was placed in the lithotomy position and the cervix brought into view with sterile speculum. A ring forcep was used to guide the 60F foley through the internal os of the cervix. Foley Balloon filled with 60cc of sterile water. Plug inserted into end of the foley. Foley placed on tension and taped to medial thigh.  NST:  EFM Baseline: 135 bpm, Variability: Good {> 6 bpm), Accelerations: Reactive and Decelerations: Absent  Toco: none There were no signs of tachysystole or hypertonus. All equipment was removed and accounted for. The patient tolerated the procedure well.  Assessment and Plan:  Pregnancy: [id number] at [redacted]w[redacted]d 1. Supervision of other normal pregnancy, antepartum Patient is doing well without complaints  2. Gestational hypertension without significant proteinuria in third trimester Patient scheduled for IOL 11/16  3. Group B streptococcal bacteriuria Prophylaxis in labor  4. Unwanted fertility Patient remains interested in BTL   S/p Outpatient placement of foley balloon catheter for cervical ripening. Induction of labor scheduled for tomorrow. Reassuring FHR tracing with no concerns at present. Warning signs given to patient to include return to MAU for heavy vaginal bleeding, Rupture of membranes, painful uterine contractions q 5 mins or less, severe abdominal discomfort, decreased fetal movement.  Return for postpartum.   Mora Bellman, MD 04/30/2020 2:04 PM

## 2020-04-30 NOTE — Progress Notes (Signed)
OB Foley Bulb placement and NST

## 2020-05-01 ENCOUNTER — Inpatient Hospital Stay (HOSPITAL_COMMUNITY): Payer: Medicaid Other | Admitting: Anesthesiology

## 2020-05-01 ENCOUNTER — Other Ambulatory Visit: Payer: Self-pay

## 2020-05-01 ENCOUNTER — Inpatient Hospital Stay (HOSPITAL_COMMUNITY)
Admission: AD | Admit: 2020-05-01 | Discharge: 2020-05-04 | DRG: 798 | Disposition: A | Discharge status: Home / Self Care | Payer: Medicaid Other | Attending: Obstetrics and Gynecology | Admitting: Obstetrics and Gynecology

## 2020-05-01 ENCOUNTER — Inpatient Hospital Stay (HOSPITAL_COMMUNITY): Payer: Medicaid Other

## 2020-05-01 DIAGNOSIS — O99214 Obesity complicating childbirth: Secondary | ICD-10-CM | POA: Diagnosis present

## 2020-05-01 DIAGNOSIS — O133 Gestational [pregnancy-induced] hypertension without significant proteinuria, third trimester: Secondary | ICD-10-CM

## 2020-05-01 DIAGNOSIS — Z3A37 37 weeks gestation of pregnancy: Secondary | ICD-10-CM | POA: Diagnosis not present

## 2020-05-01 DIAGNOSIS — Z20822 Contact with and (suspected) exposure to covid-19: Secondary | ICD-10-CM | POA: Diagnosis present

## 2020-05-01 DIAGNOSIS — Z3480 Encounter for supervision of other normal pregnancy, unspecified trimester: Secondary | ICD-10-CM

## 2020-05-01 DIAGNOSIS — O134 Gestational [pregnancy-induced] hypertension without significant proteinuria, complicating childbirth: Secondary | ICD-10-CM | POA: Diagnosis present

## 2020-05-01 DIAGNOSIS — D259 Leiomyoma of uterus, unspecified: Secondary | ICD-10-CM | POA: Diagnosis present

## 2020-05-01 DIAGNOSIS — Z302 Encounter for sterilization: Secondary | ICD-10-CM | POA: Diagnosis not present

## 2020-05-01 DIAGNOSIS — O99824 Streptococcus B carrier state complicating childbirth: Secondary | ICD-10-CM | POA: Diagnosis present

## 2020-05-01 DIAGNOSIS — O3413 Maternal care for benign tumor of corpus uteri, third trimester: Secondary | ICD-10-CM | POA: Diagnosis present

## 2020-05-01 DIAGNOSIS — O43123 Velamentous insertion of umbilical cord, third trimester: Secondary | ICD-10-CM | POA: Diagnosis present

## 2020-05-01 DIAGNOSIS — O139 Gestational [pregnancy-induced] hypertension without significant proteinuria, unspecified trimester: Secondary | ICD-10-CM | POA: Diagnosis present

## 2020-05-01 DIAGNOSIS — E669 Obesity, unspecified: Secondary | ICD-10-CM | POA: Diagnosis present

## 2020-05-01 LAB — CBC
HCT: 39.9 % (ref 36.0–46.0)
HCT: 34.5 % — ABNORMAL LOW (ref 36.0–46.0)
Hemoglobin: 13.6 g/dL (ref 12.0–15.0)
Hemoglobin: 12 g/dL (ref 12.0–15.0)
MCH: 33.1 pg (ref 26.0–34.0)
MCH: 33.1 pg (ref 26.0–34.0)
MCHC: 34.1 g/dL (ref 30.0–36.0)
MCHC: 34.8 g/dL (ref 30.0–36.0)
MCV: 97.1 fL (ref 80.0–100.0)
MCV: 95.3 fL (ref 80.0–100.0)
Platelets: 207 10*3/uL (ref 150–400)
Platelets: 197 10*3/uL (ref 150–400)
RBC: 4.11 MIL/uL (ref 3.87–5.11)
RBC: 3.62 MIL/uL — ABNORMAL LOW (ref 3.87–5.11)
RDW: 14.2 % (ref 11.5–15.5)
RDW: 14 % (ref 11.5–15.5)
WBC: 11.4 10*3/uL — ABNORMAL HIGH (ref 4.0–10.5)
WBC: 12.3 10*3/uL — ABNORMAL HIGH (ref 4.0–10.5)
nRBC: 0 % (ref 0.0–0.2)
nRBC: 0 % (ref 0.0–0.2)

## 2020-05-01 LAB — COMPREHENSIVE METABOLIC PANEL
ALT: 14 U/L (ref 0–44)
AST: 23 U/L (ref 15–41)
Albumin: 2.9 g/dL — ABNORMAL LOW (ref 3.5–5.0)
Alkaline Phosphatase: 51 U/L (ref 38–126)
Anion gap: 10 (ref 5–15)
BUN: 6 mg/dL (ref 6–20)
CO2: 19 mmol/L — ABNORMAL LOW (ref 22–32)
Calcium: 9.4 mg/dL (ref 8.9–10.3)
Chloride: 107 mmol/L (ref 98–111)
Creatinine, Ser: 0.73 mg/dL (ref 0.44–1.00)
GFR, Estimated: >60 mL/min (ref >60)
Glucose, Bld: 93 mg/dL (ref 70–99)
Potassium: 3.6 mmol/L (ref 3.5–5.1)
Sodium: 136 mmol/L (ref 135–145)
Total Bilirubin: 0.6 mg/dL (ref 0.3–1.2)
Total Protein: 6.4 g/dL — ABNORMAL LOW (ref 6.5–8.1)

## 2020-05-01 LAB — TYPE AND SCREEN
ABO/RH(D): O POS
Antibody Screen: NEGATIVE

## 2020-05-01 LAB — PROTEIN / CREATININE RATIO, URINE
Creatinine, Urine: 101.47 mg/dL
Protein Creatinine Ratio: 0.11 mg/mg{Cre} (ref 0.00–0.15)
Total Protein, Urine: 11 mg/dL

## 2020-05-01 LAB — RPR
RPR Ser Ql: REACTIVE — AB
RPR Titer: 1:8 {titer}

## 2020-05-01 MED ORDER — LACTATED RINGERS IV SOLN: 500.0000 mL | Freq: Once | INTRAVENOUS | Status: DC

## 2020-05-01 MED ORDER — ACETAMINOPHEN 325 MG PO TABS: 650.0000 mg | ORAL_TABLET | ORAL | Status: DC | PRN

## 2020-05-01 MED ORDER — EPHEDRINE 5 MG/ML INJ: 10.0000 mg | INTRAVENOUS | Status: DC | PRN

## 2020-05-01 MED ORDER — SODIUM CHLORIDE 0.9 % IV SOLN
5.0000 10*6.[IU] | Freq: Once | INTRAVENOUS | Status: AC
  Administered 2020-05-01: 5 10*6.[IU] via INTRAVENOUS
  Filled 2020-05-01: qty 5

## 2020-05-01 MED ORDER — MISOPROSTOL 25 MCG QUARTER TABLET
25.0000 ug | ORAL_TABLET | ORAL | Status: DC
  Administered 2020-05-01: 25 ug via VAGINAL

## 2020-05-01 MED ORDER — FENTANYL CITRATE (PF) 100 MCG/2ML IJ SOLN
50.0000 ug | INTRAMUSCULAR | Status: DC | PRN
  Administered 2020-05-01: 100 ug via INTRAVENOUS
  Filled 2020-05-01: qty 2

## 2020-05-01 MED ORDER — PHENYLEPHRINE 40 MCG/ML (10ML) SYRINGE FOR IV PUSH (FOR BLOOD PRESSURE SUPPORT)
80.0000 ug | PREFILLED_SYRINGE | INTRAVENOUS | Status: DC | PRN
  Administered 2020-05-02 (×2): 80 ug via INTRAVENOUS
  Filled 2020-05-01: qty 10

## 2020-05-01 MED ORDER — PENICILLIN G POT IN DEXTROSE 60000 UNIT/ML IV SOLN
3.0000 10*6.[IU] | INTRAVENOUS | Status: DC
  Administered 2020-05-01 – 2020-05-02 (×4): 3 10*6.[IU] via INTRAVENOUS
  Filled 2020-05-01 (×4): qty 50

## 2020-05-01 MED ORDER — SODIUM CHLORIDE (PF) 0.9 % IJ SOLN
INTRAMUSCULAR | Status: DC | PRN
  Administered 2020-05-01: 12 mL/h via EPIDURAL

## 2020-05-01 MED ORDER — FENTANYL-BUPIVACAINE-NACL 0.5-0.125-0.9 MG/250ML-% EP SOLN
12.0000 mL/h | EPIDURAL | Status: DC | PRN
  Filled 2020-05-01: qty 250

## 2020-05-01 MED ORDER — LACTATED RINGERS IV SOLN
INTRAVENOUS | Status: DC
  Administered 2020-05-01 (×2): 125 mL/h via INTRAVENOUS

## 2020-05-01 MED ORDER — OXYCODONE-ACETAMINOPHEN 5-325 MG PO TABS: 2.0000 | ORAL_TABLET | ORAL | Status: DC | PRN

## 2020-05-01 MED ORDER — PHENYLEPHRINE 40 MCG/ML (10ML) SYRINGE FOR IV PUSH (FOR BLOOD PRESSURE SUPPORT)
80.0000 ug | PREFILLED_SYRINGE | INTRAVENOUS | Status: DC | PRN
  Filled 2020-05-01: qty 10

## 2020-05-01 MED ORDER — OXYTOCIN-SODIUM CHLORIDE 30-0.9 UT/500ML-% IV SOLN
2.5000 [IU]/h | INTRAVENOUS | Status: DC
  Administered 2020-05-02: 2.5 [IU]/h via INTRAVENOUS

## 2020-05-01 MED ORDER — OXYTOCIN-SODIUM CHLORIDE 30-0.9 UT/500ML-% IV SOLN
1.0000 m[IU]/min | INTRAVENOUS | Status: DC
  Administered 2020-05-01: 2 m[IU]/min via INTRAVENOUS
  Filled 2020-05-01: qty 500

## 2020-05-01 MED ORDER — LIDOCAINE HCL (PF) 1 % IJ SOLN
INTRAMUSCULAR | Status: DC | PRN
  Administered 2020-05-01: 5 mL via EPIDURAL
  Administered 2020-05-01: 3 mL via EPIDURAL

## 2020-05-01 MED ORDER — ONDANSETRON HCL 4 MG/2ML IJ SOLN: 4.0000 mg | Freq: Four times a day (QID) | INTRAMUSCULAR | Status: DC | PRN

## 2020-05-01 MED ORDER — MISOPROSTOL 25 MCG QUARTER TABLET
ORAL_TABLET | ORAL | Status: AC
  Filled 2020-05-01: qty 1

## 2020-05-01 MED ORDER — OXYTOCIN BOLUS FROM INFUSION
333.0000 mL | Freq: Once | INTRAVENOUS | Status: AC
  Administered 2020-05-02: 333 mL via INTRAVENOUS

## 2020-05-01 MED ORDER — OXYCODONE-ACETAMINOPHEN 5-325 MG PO TABS: 1.0000 | ORAL_TABLET | ORAL | Status: DC | PRN

## 2020-05-01 MED ORDER — LIDOCAINE HCL (PF) 1 % IJ SOLN: 30.0000 mL | INTRAMUSCULAR | Status: DC | PRN

## 2020-05-01 MED ORDER — LACTATED RINGERS IV SOLN: 500.0000 mL | INTRAVENOUS | Status: DC | PRN

## 2020-05-01 MED ORDER — SOD CITRATE-CITRIC ACID 500-334 MG/5ML PO SOLN
30.0000 mL | ORAL | Status: DC | PRN
  Administered 2020-05-02: 30 mL via ORAL
  Filled 2020-05-01: qty 15

## 2020-05-01 MED ORDER — DIPHENHYDRAMINE HCL 50 MG/ML IJ SOLN: 12.5000 mg | INTRAMUSCULAR | Status: DC | PRN

## 2020-05-01 MED ORDER — TERBUTALINE SULFATE 1 MG/ML IJ SOLN: 0.2500 mg | Freq: Once | INTRAMUSCULAR | Status: DC | PRN

## 2020-05-01 NOTE — Progress Notes (Addendum)
Patient ID: Chelsey Cook, female   DOB: 02/02/1986, 34 y.o.   MRN: [id number] Chelsey Cook is a 34 y.o. [phone] at [redacted]w[redacted]d admitted for induction of labor due to [facility].  Subjective: Just got IV pain meds, doing well. Denies ha, visual changes, ruq/epigastric pain, n/v.  Does want epidural at some point  Objective: BP (!) 98/44 (BP Location: Right Arm)    Pulse 67    Temp 98.6 F (37 C) (Oral)    Resp 17    Ht 5\' 3"  (1.6 m)    Wt 79.7 kg    LMP 08/16/2019    BMI 31.14 kg/m  No intake/output data recorded.  FHT:  FHR: 135 bpm, variability: moderate,  accelerations:  Present,  decelerations:  Absent UC:   q 1-12mins  SVE:   Dilation: 4 Effacement (%): 50 Station: Ballotable Exam by:: Knute Neu  VTX  Pitocin @ 6 mu/min (just increased at 2105)  Labs: Lab Results  Component Value Date   WBC 12.3 (H) 05/01/2020   HGB 12.0 05/01/2020   HCT 34.5 (L) 05/01/2020   MCV 95.3 05/01/2020   PLT 197 05/01/2020    Assessment / Plan: IOL d/t GHTN, s/p outpatient foley bulb and cytotec x 1. Pitocin started at 1400, currently on 6. Continue to increase to acheive adequate labor. Plan ROM when vtx lower  Labor: s/p cervical ripening Fetal Wellbeing:  Category I Pain Control:  IV pain meds Pre-eclampsia: bp's great, asymptomatic, labs normal I/D:  PCN for GBS+ Anticipated MOD:  NSVD  Roma Schanz CNM, WHNP-BC 05/01/2020, 9:15 PM

## 2020-05-01 NOTE — Progress Notes (Addendum)
Chelsey Cook is a 34 y.o. [id number] at [redacted]w[redacted]d by   Subjective: Endorses contraction pain 5-6/10, no pain between contractions. Planning epidural during active labor, declines pain medicine at this time. Would like to ambulate.  Objective: BP 114/65   Pulse 92   Temp 98.7 F (37.1 C) (Oral)   Resp 16   Ht 5\' 3"  (1.6 m)   Wt 79.7 kg   LMP 08/16/2019   BMI 31.14 kg/m  No intake/output data recorded. No intake/output data recorded.  FHT:  FHR: 135 bpm, variability: moderate,  accelerations:  Present,  decelerations:  Absent UC:   regular, every 2-4 minutes SVE:   Dilation: 4 Effacement (%): 60 Station: Ballotable Exam by:: Maryelizabeth Kaufmann, CNM  Labs: Lab Results  Component Value Date   WBC 11.4 (H) 05/01/2020   HGB 13.6 05/01/2020   HCT 39.9 05/01/2020   MCV 97.1 05/01/2020   PLT 207 05/01/2020   Patient Vitals for the past 24 hrs:  BP Temp Temp src Pulse Resp Height Weight  05/01/20 1731 125/74 98.6 F (37 C) Oral 99 18 -- --  05/01/20 1701 114/65 -- -- 92 16 -- --  05/01/20 1631 122/72 -- -- 92 16 -- --  05/01/20 1601 120/67 -- -- 88 16 -- --  05/01/20 1531 110/71 -- -- 92 16 -- --  05/01/20 1501 115/70 -- -- 91 16 -- --  05/01/20 1431 115/72 -- -- 99 16 -- --  05/01/20 1401 116/67 -- -- 92 16 -- --  05/01/20 1329 -- 98.7 F (37.1 C) Oral -- -- -- --  05/01/20 1327 135/70 -- -- (!) 111 16 -- --  05/01/20 1147 113/65 -- -- (!) 112 16 -- --  05/01/20 0840 126/75 98.3 F (36.8 C) Oral 87 16 5\' 3"  (1.6 m) 79.7 kg    Assessment / Plan: 34 y.o. G5P1031 at [redacted]w[redacted]d  --Cat I tracing --GBS pos: PCN #2 hung at 1500 --Normotensive throughout shift --Cervix unchanged from previous, continue Pitocin titration --Encouraged ambulation, position changes including birthing ball, shallow lunges --Anticipate NSVD  [name], CNM 05/01/2020, 5:37 PM

## 2020-05-01 NOTE — Anesthesia Preprocedure Evaluation (Signed)
Anesthesia Evaluation  Patient identified by MRN, date of birth, ID band Patient awake    Reviewed: Allergy & Precautions, H&P , NPO status , Patient's Chart, lab work & pertinent test results  Airway Mallampati: II   Neck ROM: full    Dental   Pulmonary neg pulmonary ROS,    breath sounds clear to auscultation       Cardiovascular hypertension,  Rhythm:regular Rate:Normal     Neuro/Psych    GI/Hepatic   Endo/Other    Renal/GU      Musculoskeletal   Abdominal   Peds  Hematology   Anesthesia Other Findings   Reproductive/Obstetrics (+) Pregnancy                             Anesthesia Physical Anesthesia Plan  ASA: I  Anesthesia Plan: Epidural   Post-op Pain Management:    Induction: Intravenous  PONV Risk Score and Plan: 2 and Treatment may vary due to age or medical condition  Airway Management Planned: Natural Airway  Additional Equipment:   Intra-op Plan:   Post-operative Plan:   Informed Consent: I have reviewed the patients History and Physical, chart, labs and discussed the procedure including the risks, benefits and alternatives for the proposed anesthesia with the patient or authorized representative who has indicated his/her understanding and acceptance.       Plan Discussed with: Anesthesiologist  Anesthesia Plan Comments:         Anesthesia Quick Evaluation

## 2020-05-01 NOTE — Progress Notes (Signed)
Pt came in with FB in place. FB out during [place] exam.

## 2020-05-01 NOTE — Anesthesia Procedure Notes (Signed)
Epidural Patient location during procedure: OB Start time: 05/01/2020 10:26 PM End time: 05/01/2020 10:34 PM  Staffing Anesthesiologist: Albertha Ghee, MD Performed: anesthesiologist   Preanesthetic Checklist Completed: patient identified, IV checked, site marked, risks and benefits discussed, monitors and equipment checked, pre-op evaluation and timeout performed  Epidural Patient position: sitting Prep: DuraPrep Patient monitoring: heart rate, cardiac monitor, continuous pulse ox and blood pressure Approach: midline Location: L2-L3 Injection technique: LOR saline  Needle:  Needle type: Tuohy  Needle gauge: 17 G Needle length: 9 cm Needle insertion depth: 5 cm Catheter type: closed end flexible Catheter size: 19 Gauge Catheter at skin depth: 11 cm Test dose: negative and Other  Assessment Events: blood not aspirated, injection not painful, no injection resistance and negative IV test  Additional Notes Informed consent obtained prior to proceeding including risk of failure, 1% risk of PDPH, risk of minor discomfort and bruising.  Discussed rare but serious complications including epidural abscess, permanent nerve injury, epidural hematoma.  Discussed alternatives to epidural analgesia and patient desires to proceed.  Timeout performed pre-procedure verifying patient name, procedure, and platelet count.  Patient tolerated procedure well. Reason for block:procedure for pain

## 2020-05-01 NOTE — Progress Notes (Signed)
Chelsey Cook is a 34 y.o. [id number] at [redacted]w[redacted]d  Subjective: Doing well, questions answered. Aware of contractions but denies pain. Partner Peter at bedside and engaged in care. Considering getting epidural.  Objective: BP 135/70   Pulse (!) 111   Temp 98.7 F (37.1 C) (Oral)   Resp 16   Ht 5\' 3"  (1.6 m)   Wt 79.7 kg   LMP 08/16/2019   BMI 31.14 kg/m  No intake/output data recorded. No intake/output data recorded.  FHT:  FHR: 135 bpm, variability: moderate,  accelerations:  Present,  decelerations:  Absent UC:   regular, every 2-3 minutes SVE:   Dilation: 4 Effacement (%): 60 Station: Ballotable Exam by:: [employer]: Lab Results  Component Value Date   WBC 11.4 (H) 05/01/2020   HGB 13.6 05/01/2020   HCT 39.9 05/01/2020   MCV 97.1 05/01/2020   PLT 207 05/01/2020   Patient Vitals for the past 24 hrs:  BP Temp Temp src Pulse Resp Height Weight  05/01/20 1329 -- 98.7 F (37.1 C) Oral -- -- -- --  05/01/20 1327 135/70 -- -- (!) 111 16 -- --  05/01/20 1147 113/65 -- -- (!) 112 16 -- --  05/01/20 0840 126/75 98.3 F (36.8 C) Oral 87 16 5\' 3"  (1.6 m) 79.7 kg   Assessment / Plan: --34 y.o. G5P1031 at [redacted]w[redacted]d  --Cat I tracing --GBS+: Second dose PCN hanging now --Original P:Cr sample contaminated by blood from foley bulb. New sample in process now --GHTN: asymptomatic, normotensive since admission --Contracting q 2-3 min, will initiate Pitocin 2 x 2 when contractions space out --Anticipated MOD:  NSVD  [name], CNM 05/01/2020, 1:34 PM

## 2020-05-01 NOTE — H&P (Signed)
Chelsey Cook is a 34 y.o. female presenting for IOL for Gestational Hypertension. At time of admission she denies vaginal bleeding, leaking of fluid, decreased fetal movement, fever, falls, or recent illness.  She also denies headache, visual disturbances, RUQ/epigastric pain, new onset swelling, syncope, or fever.  OB History    Gravida  5   Para  1   Term  1   Preterm      AB  3   Living  1     SAB  1   TAB  1   Ectopic      Multiple      Live Births  1          Nursing Staff Provider  Office Location  [place] Dating  LMP  Language   ENGLISH Anatomy US  Normal except for echogenic IC and uterine fibroids  Flu Vaccine  04/26/20 Genetic Screen  NIPS: LR  AFP:   First Screen:  Quad:    TDaP vaccine    GIVEN 03-15-20 Hgb A1C or  GTT  Third trimester Glucose, Fasting 65 - 91 mg/dL 84   Glucose, 1 hour 65 - 179 mg/dL 156   Glucose, 2 hour 65 - 152 mg/dL 118      Rhogam     LAB RESULTS     Blood Type O/Positive/-- (05/27 1107) O positive  Feeding Plan  BREASTFEED Antibody Negative (05/27 1107)negative  Contraception  TUBAL Rubella 12.60 (05/27 1107)immune  Circumcision  YES RPR Non Reactive (09/16 0956) negative  Pediatrician   UNSURE HBsAg Negative (05/27 1107) negative  Support Person  [name]/MOTHER HCVAb No results found for: HCVAB Negative**Positive  Prenatal Classes  plans HIV Non Reactive (09/16 0956)   negative  BTL Consent  GBS  (For PCN allergy, check sensitivities)   VBAC Consent  Pap 07/05/2019 repeat in 1 yr per pt     Hgb Electro  aa   BP Cuff  Ordered 11/03/2019 CF negative    SMA Increased risk, husband negative    Past Medical History:  Diagnosis Date  . Fibroid   . Vaginal Pap smear, abnormal    Past Surgical History:  Procedure Laterality Date  . COLPOSCOPY    . THERAPEUTIC ABORTION    . WISDOM TOOTH EXTRACTION     Family History: family history includes Cancer in her paternal grandmother; Hypertension in her maternal grandmother;  Obesity in her maternal grandmother and mother. Social History:  reports that she has never smoked. She has never used smokeless tobacco. She reports current alcohol use. She reports that she does not use drugs.    Maternal Diabetes: No Genetic Screening: Low Risk NIPS , Horizon screening increased risk SMA, Fragile X intermediate, partner negative Maternal Ultrasounds/Referrals: Normal =, multiple uterine fibroids noted Fetal Ultrasounds or other Referrals:  None Maternal Substance Abuse:  No Significant Maternal Medications:  None Significant Maternal Lab Results:  Group B Strep positive (bacteruria) Other Comments:  None  Review of Systems  Eyes: Negative for photophobia and visual disturbance.  Gastrointestinal: Negative for abdominal pain.  Genitourinary: Negative for vaginal bleeding.  Neurological: Negative for headaches.  All other systems reviewed and are negative.    Last menstrual period 08/16/2019, unknown if currently breastfeeding.   Physical Exam Vitals and nursing note reviewed. Exam conducted with a chaperone present.  Constitutional:      General: She is not in acute distress.    Appearance: Normal appearance. She is not ill-appearing.  Cardiovascular:  Rate and Rhythm: Normal rate.     Pulses: Normal pulses.     Heart sounds: Normal heart sounds.  Pulmonary:     Effort: Pulmonary effort is normal.     Breath sounds: Normal breath sounds.  Abdominal:     Comments: Gravid  Skin:    General: Skin is warm and dry.     Capillary Refill: Capillary refill takes less than 2 seconds.  Neurological:     General: No focal deficit present.     Mental Status: She is alert.  Psychiatric:        Mood and Affect: Mood normal.        Behavior: Behavior normal.        Thought Content: Thought content normal.        Judgment: Judgment normal.      Prenatal labs: ABO, Rh: O/Positive/-- (05/27 1107) Antibody: Negative (05/27 1107) Rubella: 12.60 (05/27  1107) RPR: Non Reactive (09/16 0956)  HBsAg: Negative (05/27 1107)  HIV: Non Reactive (09/16 0956)  GBS:   POSITIVE  Patient Vitals for the past 24 hrs:  BP Temp Temp src Pulse Resp Height Weight  05/01/20 0840 126/75 98.3 F (36.8 C) Oral 87 16 5\' 3"  (1.6 m) 79.7 kg    Assessment/Plan: --34 y.o. V9T6606 at [redacted]w[redacted]d  --IOL GHTN, PEC labs in work --Hx TSVD 2008, proven pelvis 6 lb 15 oz  Labor   --S/p outpatient foley balloon --Foley dislodged by CNM on initial assessment --SVE 0-0.4/59%/[id number] --Cephalic presentation confirmed via BSUS --25 mcg Cytotec to be placed vaginally by RN once patient has eaten breakfast --Anticipate Pitocin augmentation in 4 hours --Planning epidural for comfort management in active labor  GBS POSITIVE --PCN initiated at 0855  Fetal Surveillance --Cat I tracing --Baseline 145, mod var, + 15 x 15 accels, no decels --Toco: Irregular ctx, pain score 0/10 --EFW 55%/2950g/6lb 8oz (per MFM on 04/27/2020)  GHTN --No severe range or severe symptoms --PEC labs collected on admission  Postpartum Planning --BTL consent signed 03/29/2020, visible in Media tab --Desires inpatient circumcision --Needs pp BP check and pap smear  [name], CNM 05/01/2020, 9:37 AM

## 2020-05-02 ENCOUNTER — Inpatient Hospital Stay (HOSPITAL_COMMUNITY): Payer: Medicaid Other | Admitting: Anesthesiology

## 2020-05-02 ENCOUNTER — Encounter (HOSPITAL_COMMUNITY)
Admission: AD | Disposition: A | Discharge status: Home / Self Care | Payer: Self-pay | Source: Home / Self Care | Attending: Obstetrics and Gynecology

## 2020-05-02 ENCOUNTER — Encounter (HOSPITAL_COMMUNITY): Payer: Self-pay | Admitting: Obstetrics and Gynecology

## 2020-05-02 DIAGNOSIS — Z3A37 37 weeks gestation of pregnancy: Secondary | ICD-10-CM

## 2020-05-02 DIAGNOSIS — O99824 Streptococcus B carrier state complicating childbirth: Secondary | ICD-10-CM

## 2020-05-02 DIAGNOSIS — O134 Gestational [pregnancy-induced] hypertension without significant proteinuria, complicating childbirth: Secondary | ICD-10-CM

## 2020-05-02 DIAGNOSIS — Z302 Encounter for sterilization: Secondary | ICD-10-CM

## 2020-05-02 HISTORY — PX: TUBAL LIGATION: SHX77

## 2020-05-02 LAB — T.PALLIDUM AB, TOTAL: T Pallidum Abs: NONREACTIVE

## 2020-05-02 SURGERY — LIGATION, FALLOPIAN TUBE, POSTPARTUM
Anesthesia: Epidural | Wound class: Clean

## 2020-05-02 MED ORDER — BUPIVACAINE HCL (PF) 0.25 % IJ SOLN
INTRAMUSCULAR | Status: DC | PRN
  Administered 2020-05-02: 10 mL

## 2020-05-02 MED ORDER — DEXAMETHASONE SODIUM PHOSPHATE 4 MG/ML IJ SOLN
INTRAMUSCULAR | Status: DC | PRN
  Administered 2020-05-02: 4 mg via INTRAVENOUS

## 2020-05-02 MED ORDER — BENZOCAINE-MENTHOL 20-0.5 % EX AERO: 1.0000 "application " | INHALATION_SPRAY | CUTANEOUS | Status: DC | PRN

## 2020-05-02 MED ORDER — PRENATAL MULTIVITAMIN CH
1.0000 | ORAL_TABLET | Freq: Every day | ORAL | Status: DC
  Administered 2020-05-03: 1 via ORAL
  Filled 2020-05-02: qty 1

## 2020-05-02 MED ORDER — ONDANSETRON HCL 4 MG/2ML IJ SOLN: 4.0000 mg | INTRAMUSCULAR | Status: DC | PRN

## 2020-05-02 MED ORDER — BISACODYL 10 MG RE SUPP: 10.0000 mg | Freq: Every day | RECTAL | Status: DC | PRN

## 2020-05-02 MED ORDER — SODIUM BICARBONATE 8.4 % IV SOLN
INTRAVENOUS | Status: AC
  Filled 2020-05-02: qty 50

## 2020-05-02 MED ORDER — FLEET ENEMA 7-19 GM/118ML RE ENEM: 1.0000 | ENEMA | Freq: Every day | RECTAL | Status: DC | PRN

## 2020-05-02 MED ORDER — SODIUM CHLORIDE 0.9 % IV SOLN: 250.0000 mL | INTRAVENOUS | Status: DC | PRN

## 2020-05-02 MED ORDER — MIDAZOLAM HCL 5 MG/5ML IJ SOLN
INTRAMUSCULAR | Status: DC | PRN
  Administered 2020-05-02 (×2): .5 mg via INTRAVENOUS
  Administered 2020-05-02: 1 mg via INTRAVENOUS

## 2020-05-02 MED ORDER — SIMETHICONE 80 MG PO CHEW
80.0000 mg | CHEWABLE_TABLET | ORAL | Status: DC | PRN
  Administered 2020-05-03: 80 mg via ORAL
  Filled 2020-05-02: qty 1

## 2020-05-02 MED ORDER — SODIUM BICARBONATE 8.4 % IV SOLN
INTRAVENOUS | Status: DC | PRN
  Administered 2020-05-02 (×4): 5 mL via EPIDURAL

## 2020-05-02 MED ORDER — LIDOCAINE HCL (PF) 2 % IJ SOLN
INTRAMUSCULAR | Status: AC
  Filled 2020-05-02: qty 20

## 2020-05-02 MED ORDER — FENTANYL CITRATE (PF) 100 MCG/2ML IJ SOLN
INTRAMUSCULAR | Status: DC | PRN
Start: 2020-05-02 — End: 2020-05-02
  Administered 2020-05-02: 50 ug via INTRAVENOUS

## 2020-05-02 MED ORDER — DIPHENHYDRAMINE HCL 25 MG PO CAPS: 25.0000 mg | ORAL_CAPSULE | Freq: Four times a day (QID) | ORAL | Status: DC | PRN

## 2020-05-02 MED ORDER — BUPIVACAINE HCL (PF) 0.25 % IJ SOLN
INTRAMUSCULAR | Status: AC
  Filled 2020-05-02: qty 10

## 2020-05-02 MED ORDER — DIBUCAINE (PERIANAL) 1 % EX OINT: 1.0000 "application " | TOPICAL_OINTMENT | CUTANEOUS | Status: DC | PRN

## 2020-05-02 MED ORDER — ONDANSETRON HCL 4 MG/2ML IJ SOLN
INTRAMUSCULAR | Status: DC | PRN
  Administered 2020-05-02: 4 mg via INTRAVENOUS

## 2020-05-02 MED ORDER — ACETAMINOPHEN 325 MG PO TABS
650.0000 mg | ORAL_TABLET | ORAL | Status: DC | PRN
  Administered 2020-05-02 – 2020-05-04 (×6): 650 mg via ORAL
  Filled 2020-05-02 (×6): qty 2

## 2020-05-02 MED ORDER — EPINEPHRINE PF 1 MG/ML IJ SOLN
INTRAMUSCULAR | Status: AC
  Filled 2020-05-02: qty 1

## 2020-05-02 MED ORDER — ZOLPIDEM TARTRATE 5 MG PO TABS: 5.0000 mg | ORAL_TABLET | Freq: Every evening | ORAL | Status: DC | PRN

## 2020-05-02 MED ORDER — FENTANYL CITRATE (PF) 100 MCG/2ML IJ SOLN
INTRAMUSCULAR | Status: AC
  Filled 2020-05-02: qty 2

## 2020-05-02 MED ORDER — TETANUS-DIPHTH-ACELL PERTUSSIS 5-2.5-18.5 LF-MCG/0.5 IM SUSY: 0.5000 mL | PREFILLED_SYRINGE | Freq: Once | INTRAMUSCULAR | Status: DC

## 2020-05-02 MED ORDER — WITCH HAZEL-GLYCERIN EX PADS: 1.0000 "application " | MEDICATED_PAD | CUTANEOUS | Status: DC | PRN

## 2020-05-02 MED ORDER — INFLUENZA VAC SPLIT QUAD 0.5 ML IM SUSY: 0.5000 mL | PREFILLED_SYRINGE | INTRAMUSCULAR | Status: DC

## 2020-05-02 MED ORDER — SODIUM CHLORIDE 0.9% FLUSH: 3.0000 mL | INTRAVENOUS | Status: DC | PRN

## 2020-05-02 MED ORDER — DEXAMETHASONE SODIUM PHOSPHATE 4 MG/ML IJ SOLN
INTRAMUSCULAR | Status: AC
  Filled 2020-05-02: qty 1

## 2020-05-02 MED ORDER — FENTANYL CITRATE (PF) 100 MCG/2ML IJ SOLN
INTRAMUSCULAR | Status: DC | PRN
  Administered 2020-05-02: 50 ug via EPIDURAL

## 2020-05-02 MED ORDER — MIDAZOLAM HCL 2 MG/2ML IJ SOLN
INTRAMUSCULAR | Status: AC
  Filled 2020-05-02: qty 2

## 2020-05-02 MED ORDER — SODIUM CHLORIDE 0.9% FLUSH
3.0000 mL | Freq: Two times a day (BID) | INTRAVENOUS | Status: DC
  Administered 2020-05-02: 3 mL via INTRAVENOUS

## 2020-05-02 MED ORDER — LACTATED RINGERS IV SOLN: INTRAVENOUS | Status: DC | PRN

## 2020-05-02 MED ORDER — SENNOSIDES-DOCUSATE SODIUM 8.6-50 MG PO TABS
2.0000 | ORAL_TABLET | ORAL | Status: DC
  Administered 2020-05-02 – 2020-05-04 (×2): 2 via ORAL
  Filled 2020-05-02 (×2): qty 2

## 2020-05-02 MED ORDER — ONDANSETRON HCL 4 MG/2ML IJ SOLN
INTRAMUSCULAR | Status: AC
  Filled 2020-05-02: qty 2

## 2020-05-02 MED ORDER — MEASLES, MUMPS & RUBELLA VAC IJ SOLR: 0.5000 mL | Freq: Once | INTRAMUSCULAR | Status: DC

## 2020-05-02 MED ORDER — COCONUT OIL OIL: 1.0000 "application " | TOPICAL_OIL | Status: DC | PRN

## 2020-05-02 MED ORDER — ONDANSETRON HCL 4 MG PO TABS: 4.0000 mg | ORAL_TABLET | ORAL | Status: DC | PRN

## 2020-05-02 SURGICAL SUPPLY — 21 items
BLADE SURG 11 STRL SS (BLADE) ×3 IMPLANT
DRSG OPSITE POSTOP 3X4 (GAUZE/BANDAGES/DRESSINGS) ×3 IMPLANT
DURAPREP 26ML APPLICATOR (WOUND CARE) ×3 IMPLANT
GLOVE BIOGEL PI IND STRL 7.0 (GLOVE) ×1 IMPLANT
GLOVE BIOGEL PI IND STRL 7.5 (GLOVE) ×1 IMPLANT
GLOVE BIOGEL PI INDICATOR 7.0 (GLOVE) ×2
GLOVE BIOGEL PI INDICATOR 7.5 (GLOVE) ×2
GLOVE ECLIPSE 7.5 STRL STRAW (GLOVE) ×3 IMPLANT
GOWN STRL REUS W/TWL LRG LVL3 (GOWN DISPOSABLE) ×6 IMPLANT
NEEDLE HYPO 22GX1.5 SAFETY (NEEDLE) ×3 IMPLANT
NS IRRIG 1000ML POUR BTL (IV SOLUTION) ×3 IMPLANT
PACK ABDOMINAL MINOR (CUSTOM PROCEDURE TRAY) ×3 IMPLANT
PROTECTOR NERVE ULNAR (MISCELLANEOUS) ×3 IMPLANT
SPONGE LAP 18X18 RF (DISPOSABLE) ×3 IMPLANT
SPONGE LAP 4X18 RFD (DISPOSABLE) IMPLANT
SUT PLAIN 0 NONE (SUTURE) IMPLANT
SUT VICRYL 0 UR6 27IN ABS (SUTURE) ×3 IMPLANT
SUT VICRYL 4-0 PS2 18IN ABS (SUTURE) ×3 IMPLANT
SYR CONTROL 10ML LL (SYRINGE) ×3 IMPLANT
TOWEL OR 17X24 6PK STRL BLUE (TOWEL DISPOSABLE) ×6 IMPLANT
TRAY FOLEY W/BAG SLVR 14FR (SET/KITS/TRAYS/PACK) ×3 IMPLANT

## 2020-05-02 NOTE — Op Note (Signed)
NAME@ 05/02/2020  PREOPERATIVE DIAGNOSIS:  Undesired fertility  POSTOPERATIVE DIAGNOSIS:  Undesired fertility  PROCEDURE:  Postpartum Bilateral Tubal Sterilization using Pomeroy method   ANESTHESIA:  Epidural  ASSISTANTS: [employer] (PA student)  COMPLICATIONS:  None immediate.  ESTIMATED BLOOD LOSS:  Less than 20cc.  FLUIDS: 1000 cc LR.  URINE OUTPUT:  1400 cc of clear urine.  INDICATIONS: 34 y.o. yo J4G9201  with undesired fertility,status post vaginal delivery, desires permanent sterilization. Risks and benefits of procedure discussed with patient including permanence of method, bleeding, infection, injury to surrounding organs and need for additional procedures. Risk failure of 0.5-1% with increased risk of ectopic gestation if pregnancy occurs was also discussed with patient.   FINDINGS:  Normal uterus, tubes, and ovaries.  TECHNIQUE: After informed consent was obtained, the patient was taken to the operating room where anesthesia was induced and found to be adequate. A small transverse, infraumbilical skin incision was made with the scalpel. This incision was carried down to the underlying layer of fascia. The fascia was grasped with Allis clamps tented up and entered sharply with Mayo scissors. The peritoneum was entered at the same time. The patient's left fallopian tube was then identified, brought to the incision, and grasped with a Babcock clamp. The tube was then followed out to the fimbria. The Babcock clamp was then used to grasp the tube approximately 4 cm from the cornual region. A 3 cm segment of the tube was then ligated with two free ties of plain gut suture, transected and excised. Good hemostasis was noted and the tube was returned to the abdomen. The right fallopian tube was then identified to its fimbriated end, ligated, and a 3 cm segment excised in a similar fashion. Excellent hemostasis was noted, and the tube returned to the abdomen. The fascia was re-approximated with  0 Vicryl. The skin was closed in a subcuticular fashion with 3-0 Vicryl. 10 cc of quarter percent Marcaine solution was then injected at the incision site. The patient tolerated the procedure well. Sponge, lap, and needle count were correct x2. The patient was taken to recovery room in stable condition.

## 2020-05-02 NOTE — Lactation Note (Addendum)
This note was copied from a baby's chart. Lactation Consultation Note  Patient Name: Chelsey Cook Today's Date: 05/02/2020  [facility] entered room and infant crying and trying to eat blanket.  This is moms second baby.  Her first girl is 36 years old, but first time breastfeeding.  [place] asked if she could assist with some hand expression and spoon feeding.  Parents agreed.  Discussed pumping and giving a supplement of moms milk past breastfeeding since he is early term and just slightly over 6 pounds. Mom reports he goal is to breast and bottle feed.  Mom reports she would like to do mostly breastmilk in the bottle if she can.  Mom has private insurance but has not gotten a breastpump for home use.  Parents with minimal breastfeeding education. Showed mom how to hand express.  Large drops of colostrum easily obtained.  Mom able to return demo and hand express large drops of colostrum as well.  Lc and parents gave infant close to 10 ml of breastmilk via spoon.  LC assisted with latch on moms right breast in cross cradle hold. Infant latches well and opens wide.  At this time he is doing some tongue thrusting and mom is making sure to keep his nose clear by pulling back on tissue and he is coming off and on the breast.  Attempts got better and better and LC showed dad how to tcup nipple to help him maintain. Left dad assisting while LC went and got DEBP. Infant breastfed about 15 minutes and came off and assisted parents with switching him to left breast in football position.  Infant breastfed well for a few minutes and then started tongue thrusting.  Nipple coming out of his mouth. After a few attempts just left him next to mom. He was content. Lc initiated pumping on right breast with DEBP.  Used 27 mm flanges and cocnut oil.  Discussed how mom may need to increase flange size with continued pumping but right now 27 fit well.  LC stayed duration of pumping. Reviewed Understanding Mother and baby.  Urged  breastmilk as best/preferrred feeding choice for infants no matter how its given.  Urged to feed 8-12 or more times a day based on cues.   Urged parents to follow up with lactation as needed.          Roselina Burgueno Thompson Caul 05/02/2020, 7:57 PM

## 2020-05-02 NOTE — Anesthesia Postprocedure Evaluation (Signed)
Anesthesia Post Note  Patient: Chelsey Cook  Procedure(s) Performed: AN AD [place]     Patient location during evaluation: Other Anesthesia Type: Epidural Level of consciousness: awake and alert and oriented Pain management: satisfactory to patient Vital Signs Assessment: post-procedure vital signs reviewed and stable Respiratory status: respiratory function stable Cardiovascular status: stable Postop Assessment: no headache, no backache, epidural receding, patient able to bend at knees, no signs of nausea or vomiting and adequate PO intake Anesthetic complications: no   No complications documented.  Last Vitals:  Vitals:   05/02/20 1204 05/02/20 1258  BP: 113/79 136/77  Pulse: 75 87  Resp:    Temp: 36.9 C 36.9 C  SpO2:      Last Pain:  Vitals:   05/02/20 1258  TempSrc:   PainSc: 5    Pain Goal: Patients Stated Pain Goal: 0 (05/01/20 2205)                 Katherina Mires

## 2020-05-02 NOTE — Progress Notes (Signed)
Patient ID: Chelsey Cook, female   DOB: [birth year], 34 y.o.   MRN: [id number] Chelsey Cook is a 34 y.o. [phone] at [redacted]w[redacted]d admitted for induction of labor due to gestational hypertension currently on no meds  Subjective: comfortable with epidural and denies headache, visual changes, ruq/epigastric pain, n/v  Objective: BP 102/64 (BP Location: Right Arm)   Pulse 75   Temp 98.2 F (36.8 C)   Resp 17   Ht 5\' 3"  (1.6 m)   Wt 79.7 kg   LMP 08/16/2019   SpO2 99%   BMI 31.14 kg/m  No intake/output data recorded.  FHT:  FHR: 125 bpm, variability: moderate,  accelerations:  Present,  decelerations:  Absent UC:   q 2-22mins  SVE:   Dilation: 5 Effacement (%): 50 Station: -2 Exam by:: Wyvonnia Lora RN  Pitocin @ 12 mu/min  Labs: Lab Results  Component Value Date   WBC 12.3 (H) 05/01/2020   HGB 12.0 05/01/2020   HCT 34.5 (L) 05/01/2020   MCV 95.3 05/01/2020   PLT 197 05/01/2020    Assessment / Plan: IOL d/t gestational hypertension currently on no meds, s/p foley bulb (out at 0900) and cytotec x 1- last dose at 0930. Pitocin currently at 12 mu/min.  and Plan AROM if no cervical change  Labor: early Fetal Wellbeing:  Category I Pain Control:  Epidural Pre-eclampsia: asymptomatic, bp's stable and labs stable I/D:  PCN for GBS+ Anticipated MOD: NSVB  Roma Schanz CNM, [facility] 05/01/20 @ 2330

## 2020-05-02 NOTE — Progress Notes (Signed)
Patient ID: Chelsey Cook, female   DOB: [birth year], 34 y.o.   MRN: [id number] Chelsey Cook is a 34 y.o. [phone] at [redacted]w[redacted]d admitted for induction of labor due to gestational hypertension currently on no meds  Subjective: comfortable with epidural and denies headache, visual changes, ruq/epigastric pain, n/v  Objective: BP 113/69 (BP Location: Right Arm)   Pulse 93   Temp 98.4 F (36.9 C)   Resp 17   Ht 5\' 3"  (1.6 m)   Wt 79.7 kg   LMP 08/16/2019   SpO2 100%   BMI 31.14 kg/m  No intake/output data recorded.  FHT:  FHR: 130 bpm, variability: moderate,  accelerations:  Present,  decelerations:  Absent UC:   q 1-5mins  SVE:   Dilation: 6 Effacement (%): 80 Station: -1 Exam by:: Knute Neu w/ BBOW AROM mod amt clear fluid  Pitocin @ 6 mu/min  Labs: Lab Results  Component Value Date   WBC 12.3 (H) 05/01/2020   HGB 12.0 05/01/2020   HCT 34.5 (L) 05/01/2020   MCV 95.3 05/01/2020   PLT 197 05/01/2020    Assessment / Plan: IOL d/t gestational hypertension currently on no meds, s/p foley bulb, cytotec x 1, now AROM'd. Pitocin currently at 6 mu/min.   Labor: active Fetal Wellbeing:  Category I Pain Control:  Epidural Pre-eclampsia: asymptomatic, bp's stable and labs stable I/D:  PCN for GBS+ Anticipated MOD: NSVB  Roma Schanz CNM, WHNP-BC 05/02/2020, 4:19 AM

## 2020-05-02 NOTE — Discharge Summary (Signed)
  Postpartum Discharge Summary    Patient Name: Vieva I Neises DOB: 12/13/1985 MRN: [id number]  Date of admission: 05/01/2020 Delivery date:05/02/2020  Delivering provider: BOOKER, KIMBERLY R  Date of discharge: 05/04/2020  Admitting diagnosis: Gestational hypertension [O13.9] Intrauterine pregnancy: [redacted]w[redacted]d     Secondary diagnosis:  Active Problems:   Gestational hypertension  Additional problems: none    Discharge diagnosis: Term Pregnancy Delivered and Gestational Hypertension                                              Post partum procedures:postpartum tubal ligation Augmentation: AROM, Pitocin, Cytotec and OP Foley Complications: None  Hospital course: Induction of Labor With Vaginal Delivery   34 y.o. yo [id number] at [redacted]w[redacted]d was admitted to the hospital 05/01/2020 for induction of labor.  Indication for induction: Gestational hypertension.  Patient had an uncomplicated labor course as follows: Membrane Rupture Time/Date: 3:39 AM ,05/02/2020   Delivery Method:Vaginal, Spontaneous  Episiotomy: None  Lacerations:  None  Details of delivery can be found in separate delivery note.  Patient had a routine postpartum course. Patient is discharged home 05/04/20.  Newborn Data: Birth date:05/02/2020  Birth time:6:20 AM  Gender:Female  Living status:Living  Apgars:9 ,9  Weight:2750 g   Magnesium Sulfate received: No BMZ received: No Rhophylac:N/A MMR:N/A T-DaP:Given prenatally Flu: given prenatally Transfusion:No  Physical exam  Vitals:   05/03/20 0540 05/03/20 1243 05/03/20 2203 05/04/20 0520  BP: 111/63 106/68 119/80 122/80  Pulse: 84 90 86 73  Resp: 18 17 17 18  Temp: 98.2 F (36.8 C) 98.7 F (37.1 C) 98.6 F (37 C) 98.9 F (37.2 C)  TempSrc: Oral Oral Oral Oral  SpO2:  100% 100%   Weight:      Height:       General: alert, cooperative and no distress Lochia: appropriate Uterine Fundus: firm Incision: Healing well with no significant drainage, No significant  erythema, Dressing is clean, dry, and intact DVT Evaluation: No evidence of DVT seen on physical exam. No cords or calf tenderness. No significant calf/ankle edema. Labs: Lab Results  Component Value Date   WBC 15.2 (H) 05/03/2020   HGB 11.1 (L) 05/03/2020   HCT 32.4 (L) 05/03/2020   MCV 96.7 05/03/2020   PLT 181 05/03/2020   CMP Latest Ref Rng & Units 05/01/2020  Glucose 70 - 99 mg/dL 93  BUN 6 - 20 mg/dL 6  Creatinine 0.44 - 1.00 mg/dL 0.73  Sodium 135 - 145 mmol/L 136  Potassium 3.5 - 5.1 mmol/L 3.6  Chloride 98 - 111 mmol/L 107  CO2 22 - 32 mmol/L 19(L)  Calcium 8.9 - 10.3 mg/dL 9.4  Total Protein 6.5 - 8.1 g/dL 6.4(L)  Total Bilirubin 0.3 - 1.2 mg/dL 0.6  Alkaline Phos 38 - 126 U/L 51  AST 15 - 41 U/L 23  ALT 0 - 44 U/L 14   Edinburgh Score: Edinburgh Postnatal Depression Scale Screening Tool 05/03/2020  I have been able to laugh and see the funny side of things. (No Data)     After visit meds:  Allergies as of 05/04/2020      Reactions   Naproxen Anaphylaxis      Medication List    STOP taking these medications   Blood Pressure Kit Devi     TAKE these medications   acetaminophen 325 MG tablet Commonly known as: Tylenol Take   2 tablets (650 mg total) by mouth every 6 (six) hours as needed (for pain scale < 4).   coconut oil Oil Apply 1 application topically as needed (nipple pain).   Vitafol FE+ 90-0.6-0.4-200 MG Caps Take 1 tablet by mouth daily.        Discharge home in stable condition Infant Feeding: Breast Infant Disposition:home with mother Discharge instruction: per After Visit Summary and Postpartum booklet. Activity: Advance as tolerated. Pelvic rest for 6 weeks.  Diet: routine diet Future Appointments: Future Appointments  Date Time Provider [place]  05/09/2020 10:00 AM [place] None  05/15/2020 10:30 AM Lynnea Ferrier, LCSW [facility] None  06/05/2020 10:15 AM Gavin Pound, CNM [place] None   Follow up  Visit: Roma Schanz, CNM  P [facility] Please schedule this patient for PP visit in: 1 week bp check, ppv in 4wks  High risk pregnancy complicated by: GHTN  Delivery mode: SVD  Anticipated Birth Control: BTL done PP  PP Procedures needed: BP check 1 week Schedule Integrated BH visit: no  Provider: Any provider   Randa Ngo, MD OB Fellow, [facility] 05/04/2020 6:42 AM

## 2020-05-02 NOTE — Transfer of Care (Signed)
Immediate Anesthesia Transfer of Care Note  Patient: Chelsey Cook  Procedure(s) Performed: POST PARTUM TUBAL LIGATION (N/A )  Patient Location: PACU  Anesthesia Type:Epidural  Level of Consciousness: awake, alert  and oriented  Airway & Oxygen Therapy: Patient Spontanous Breathing  Post-op Assessment: Report given to RN and Post -op Vital signs reviewed and stable  Post vital signs: Reviewed and stable  Last Vitals:  Vitals Value Taken Time  BP    Temp    Pulse    Resp    SpO2      Last Pain:  Vitals:   05/02/20 0900  TempSrc:   PainSc: 0-No pain      Patients Stated Pain Goal: 0 (49/96/92 4932)  Complications: No complications documented.

## 2020-05-02 NOTE — Progress Notes (Signed)
Patient desires permanent sterilization.  Other reversible forms of contraception were discussed with patient; she declines all other modalities. Risks of procedure discussed with patient including but not limited to: risk of regret, permanence of method, bleeding, infection, injury to surrounding organs and need for additional procedures.  Failure risk of 1-2 % with increased risk of ectopic gestation if pregnancy occurs was also discussed with patient.  Patient verbalized understanding of these risks and wants to proceed with sterilization.  Written informed consent obtained.  To OR when ready.

## 2020-05-02 NOTE — Lactation Note (Signed)
Lactation Consultation Note  Patient Name: Chelsey Cook SMMOC'A Date: 05/02/2020  Lactation consult was put in on mom.  Let RN Mason Jim know that it was put in on mom and that if she wanted to see lactation to put one in on baby.    Maternal Data    Feeding    LATCH Score                   Interventions    Lactation Tools Discussed/Used     Consult Status      Evie Croston Thompson Caul 05/02/2020, 5:40 PM

## 2020-05-02 NOTE — Anesthesia Preprocedure Evaluation (Addendum)
Anesthesia Evaluation  Patient identified by MRN, date of birth, ID band Patient awake    Reviewed: Allergy & Precautions, NPO status , Patient's Chart, lab work & pertinent test results  Airway Mallampati: II  TM Distance: >3 FB Neck ROM: Full    Dental no notable dental hx.    Pulmonary neg pulmonary ROS,    Pulmonary exam normal breath sounds clear to auscultation       Cardiovascular hypertension, Normal cardiovascular exam Rhythm:Regular Rate:Normal     Neuro/Psych PSYCHIATRIC DISORDERS Anxiety negative neurological ROS     GI/Hepatic Neg liver ROS, GERD  ,  Endo/Other  Obesity  Renal/GU negative Renal ROS     Musculoskeletal negative musculoskeletal ROS (+)   Abdominal (+) + obese,   Peds  Hematology  (+) anemia ,   Anesthesia Other Findings   Reproductive/Obstetrics Desires post partum sterilization                             Anesthesia Physical Anesthesia Plan  ASA: II  Anesthesia Plan: Epidural   Post-op Pain Management:    Induction:   PONV Risk Score and Plan: 3 and Treatment may vary due to age or medical condition  Airway Management Planned: Natural Airway  Additional Equipment:   Intra-op Plan:   Post-operative Plan:   Informed Consent: I have reviewed the patients History and Physical, chart, labs and discussed the procedure including the risks, benefits and alternatives for the proposed anesthesia with the patient or authorized representative who has indicated his/her understanding and acceptance.     Dental advisory given  Plan Discussed with: Anesthesiologist and CRNA  Anesthesia Plan Comments:         Anesthesia Quick Evaluation

## 2020-05-02 NOTE — Anesthesia Postprocedure Evaluation (Signed)
Anesthesia Post Note  Patient: Chelsey Cook  Procedure(s) Performed: POST PARTUM TUBAL LIGATION (N/A )     Patient location during evaluation: PACU Anesthesia Type: Epidural Level of consciousness: oriented and awake and alert Pain management: pain level controlled Vital Signs Assessment: post-procedure vital signs reviewed and stable Respiratory status: spontaneous breathing, respiratory function stable and nonlabored ventilation Cardiovascular status: blood pressure returned to baseline and stable Postop Assessment: no headache, no backache, no apparent nausea or vomiting, epidural receding and patient able to bend at knees Anesthetic complications: no   No complications documented.  Last Vitals:  Vitals:   05/02/20 1130 05/02/20 1145  BP: 113/75   Pulse: 87   Resp: 19   Temp:  36.6 C  SpO2: 97%     Last Pain:  Vitals:   05/02/20 1145  TempSrc: Oral  PainSc: 0-No pain   Pain Goal: Patients Stated Pain Goal: 0 (05/01/20 2205)              Epidural/Spinal Function Cutaneous sensation: Normal sensation (05/02/20 1145), Patient able to flex knees: Yes (05/02/20 1145), Patient able to lift hips off bed: Yes (05/02/20 1145), Back pain beyond tenderness at insertion site: No (05/02/20 1145), Progressively worsening motor and/or sensory loss: No (05/02/20 1145), Bowel and/or bladder incontinence post epidural: No (05/02/20 1145)  Alaysha Jefcoat A.

## 2020-05-02 NOTE — Progress Notes (Signed)
Patient ID: Chelsey Cook, female   DOB: 28-Feb-1986, 34 y.o.   MRN: [id number] Chelsey Cook is a 34 y.o. [phone] at [redacted]w[redacted]d admitted for induction of labor due to gestational hypertension currently on no meds  Subjective: comfortable with epidural and denies headache, visual changes, ruq/epigastric pain, n/v  Objective: BP 105/62 (BP Location: Left Arm)   Pulse 81   Temp 98.2 F (36.8 C)   Resp 16   Ht 5\' 3"  (1.6 m)   Wt 79.7 kg   LMP 08/16/2019   SpO2 100%   BMI 31.14 kg/m  No intake/output data recorded.  FHT:  FHR: 130 bpm, variability: moderate,  accelerations:  Present,  decelerations:  Absent UC:   q 2-42mins  SVE:   Dilation: 5 Effacement (%): 50 Station: -2 Exam by:: Wyvonnia Lora RN  Pitocin @ 2 mu/min  Labs: Lab Results  Component Value Date   WBC 12.3 (H) 05/01/2020   HGB 12.0 05/01/2020   HCT 34.5 (L) 05/01/2020   MCV 95.3 05/01/2020   PLT 197 05/01/2020    Assessment / Plan: IOL d/t gestational hypertension currently on no meds, s/p foley bulb and cytotec x 1. Pitocin currently at 2 mu/min. Was stopped at  0043 d/t repetitive lates after epidural. Pt was given phenylephrine x 2 w/ resolution of lates. Pitocin restarted at 0155.   Labor: early Fetal Wellbeing:  Category I Pain Control:  Epidural Pre-eclampsia: asymptomatic, bp's stable and labs stable I/D:  PCN for GBS+ Anticipated MOD: NSVB  Roma Schanz CNM, WHNP-BC 05/02/2020, 2:08 AM

## 2020-05-03 LAB — CBC
HCT: 32.4 % — ABNORMAL LOW (ref 36.0–46.0)
Hemoglobin: 11.1 g/dL — ABNORMAL LOW (ref 12.0–15.0)
MCH: 33.1 pg (ref 26.0–34.0)
MCHC: 34.3 g/dL (ref 30.0–36.0)
MCV: 96.7 fL (ref 80.0–100.0)
Platelets: 181 10*3/uL (ref 150–400)
RBC: 3.35 MIL/uL — ABNORMAL LOW (ref 3.87–5.11)
RDW: 14.2 % (ref 11.5–15.5)
WBC: 15.2 10*3/uL — ABNORMAL HIGH (ref 4.0–10.5)
nRBC: 0 % (ref 0.0–0.2)

## 2020-05-03 LAB — SURGICAL PATHOLOGY

## 2020-05-03 NOTE — Progress Notes (Signed)
CSW received consult for hx of Anxiety during this pregnancy.  CSW met with [name] to offer support and complete assessment.    CSW congratulated [name] and [name] on the birth of infant. CSW advised [name] and [name] of the HIPPA policy and asked that [name] leave room in which both [name] and [name] were agreeable to. CSW then advised [name] of CSW's role and the reason for CSW coming to speak with her. [name] expressed that she has no mental health hx however [name] reported that she developed anxiety during this pregnancy. CSW asked [name] if it was a particular reason that led to [name]'s anxiety in which [name] Expressed "not at all, I was just on social medica lot and it made me nervous". [name] expressed that since however she has been well. [name] expressed that she did start therapy to help with anxiety in which [name] verbalized it being effective and that she has an appointment with [place] on 05/15/20. [name] expressed that she has no other needs and expressed no SI, HI nor being involved in DV.   CSW inquired from [facility] on who her supports are. [name] expressed that she has support from [name] and her mother. [name] advised CSW that she has all needed items to care for infant with plans for infant to sleep in basinet once arrived home. [name] expressed no other needs to CSW at this time.   CSW provided education regarding the baby blues period vs. perinatal mood disorders, discussed treatment and gave resources for mental health follow up if concerns arise.  CSW recommends self-evaluation during the postpartum time period using the New Mom Checklist from Postpartum Progress and encouraged [name] to contact a medical professional if symptoms are noted at any time.   CSW provided review of Sudden Infant Death Syndrome (SIDS) precautions.   CSW identifies no further need for intervention and no barriers to discharge at this time.   Chelsey Cook, MSW, LCSW [facility] [place] [facility] [place] [phone]

## 2020-05-03 NOTE — Progress Notes (Signed)
POSTPARTUM PROGRESS NOTE  Subjective: Chelsey Cook is a 34 y.o. [id number] PPD#1/POD#1 s/p NSVD and BTL at [redacted]w[redacted]d.  She reports she doing well. No acute events overnight. She denies any problems with ambulating, voiding or po intake. Denies nausea or vomiting. She has passed flatus. Pain is moderately controlled.  Lochia is present without clotting. Denies dizziness and headache.   Objective: Blood pressure 111/63, pulse 84, temperature 98.2 F (36.8 C), temperature source Oral, resp. rate 18, height 5\' 3"  (1.6 m), weight 79.7 kg, last menstrual period 08/16/2019, SpO2 97 %, unknown if currently breastfeeding.  Physical Exam:  General: alert, cooperative and no acute distress Chest: no respiratory distress Abdomen: soft, mildly tender along the periumbilical region, clean and dry honeycomb dressing in place on the umbilicus  Uterine Fundus: firm and at level of umbilicus Extremities: No calf swelling or tenderness, no LE edema noted bilaterally   Recent Labs    05/01/20 1905 05/03/20 0542  HGB 12.0 11.1*  HCT 34.5* 32.4*    Assessment/Plan: Chelsey Cook is a 33 y.o. B3H7897 PPD#1 and POD#1 s/p NSVD and BTL at [redacted]w[redacted]d.   Routine Postpartum Care: Doing well, pain moderately controlled. Vitals stable. Stable hemoglobin at 11.1 from 12, no additional iron supplementation required at this time. Continue to monitor as appropriate.  -- Continue routine care, lactation support  -- Contraception: s/p BTL day 1 -- Feeding: breast --Circumcision tentatively today, consent previously obtained.  --Most recent RPR noted to be reactive, T. Pallidum antibody nonreactive.   Dispo: Plan for discharge following circumcision.  Donney Dice, DO 05/03/2020 7:15 AM

## 2020-05-03 NOTE — Lactation Note (Signed)
This note was copied from a baby's chart. Lactation Consultation Note  Patient Name: Chelsey Cook Today's Date: 05/03/2020 Reason for consult: Follow-up assessment  Follow up with 18 hours old infant with 3.09% weight loss.. Mother states baby has been feeding well. Mother explains she is hand expressing but does not seem to collect much and nipples are sore. Demonstrated technique for mother and colostrum is plentiful. Mother able to replicate proper technique.   Mother continues using DEBP to stimulate breasts and supplement infant. Noticed flange is exact for mother's nipples. Tried a 30 mm flange, seems a better fit.   Talked about using colostrum to heal nipples and provided comfort gels to help with healing. Promoted maternal rest, hydration and food intake. Encouraged to contact [facility] for support when ready to breastfeed baby and recommended to request help for questions or concerns.    All questions answered at this time.    Maternal Data Formula Feeding for Exclusion: Yes Reason for exclusion: Mother's choice to formula and breast feed on admission Has patient been taught Hand Expression?: Yes  Interventions Interventions: Breast feeding basics reviewed;Breast massage;Hand express;Expressed milk;DEBP  Consult Status Consult Status: Follow-up Date: 05/04/20 Follow-up type: In-patient    [name] A Higuera Ancidey 05/03/2020, 6:56 PM

## 2020-05-04 ENCOUNTER — Ambulatory Visit: Payer: Medicaid Other

## 2020-05-04 MED ORDER — ACETAMINOPHEN 325 MG PO TABS: 650.0000 mg | ORAL_TABLET | Freq: Four times a day (QID) | ORAL | Status: AC | PRN

## 2020-05-04 MED ORDER — COCONUT OIL OIL: 1.0000 "application " | TOPICAL_OIL | 0 refills | Status: AC | PRN

## 2020-05-04 NOTE — Lactation Note (Addendum)
This note was copied from a baby's chart. Lactation Consultation Note  Patient Name: Boy Zelda [place]ique Today's Date: 05/04/2020 Reason for consult: Follow-up assessment;1st time breastfeeding;Early term 37-38.6wks;Infant weight loss   Infant is 37 weeks 59 hours old with 5% weight loss. Infant had 6 stools and 7 urine. He had 2 urine today and he had stool that is transitioning from black to green.   Mom had extended time between feeds 2 am to 7 am. LC reviewed with parents feeding based on cues 8-12x in 24 hour period no more than 3 hours without an attempt.  Mom had a healed abrasion on the right nipple. Mom shown how to use EBM for nipple care as well as using the coconut oil provided. Mom does not have a pump at home but in the process of getting one. LC went over with her how to use the manual pump.   Mom had some pain with the latch but with assistance with the latch she stated it resolved. Mom able to latch infant on the right for 12 minutes and now latched on the opposite side. Infant showing signs of milk transfer with the feedings.   Mom's breasts were full and heavy. Infant latched on the right for 12 minutes and still nursing on the left at the end of the feed. Dad stated Mom latching on the right side only. LC reviewed with her importance of offering both breasts ensuring infant latched deep with signs of milk transfer during feeds.   Plan 1. To feed based on cues 8-12x in 24 hour no more than 3 hours without an attempt.           2. Breastfeeding supplementation guide reviewed with parents based on hours after birth. Mom instructed on how to use the manual pump q 3 hours for 10 minutes and supplement using paced bottle feeding.           3. Signs, symptoms, treatment and prevention of engorgement reviewed and brochure provided.             4. I's and O's sheet reviewed.              5. [place] brochure of inpatient and outpatient services provided.     Consult Status Consult  Status: Complete Date: 05/04/20    Yug Loria  Nicholson-Springer 05/04/2020, 12:28 PM

## 2020-05-04 NOTE — Discharge Instructions (Signed)
Postpartum Care After Vaginal Delivery This sheet gives you information about how to care for yourself from the time you deliver your baby to up to 6-12 weeks after delivery (postpartum period). Your health care provider may also give you more specific instructions. If you have problems or questions, contact your health care provider. Follow these instructions at home: Vaginal bleeding  It is normal to have vaginal bleeding (lochia) after delivery. Wear a sanitary pad for vaginal bleeding and discharge. ? During the first week after delivery, the amount and appearance of lochia is often similar to a menstrual period. ? Over the next few weeks, it will gradually decrease to a dry, yellow-brown discharge. ? For most women, lochia stops completely by 4-6 weeks after delivery. Vaginal bleeding can vary from woman to woman.  Change your sanitary pads frequently. Watch for any changes in your flow, such as: ? A sudden increase in volume. ? A change in color. ? Large blood clots.  If you pass a blood clot from your vagina, save it and call your health care provider to discuss. Do not flush blood clots down the toilet before talking with your health care provider.  Do not use tampons or douches until your health care provider says this is safe.  If you are not breastfeeding, your period should return 6-8 weeks after delivery. If you are feeding your child breast milk only (exclusive breastfeeding), your period may not return until you stop breastfeeding. Perineal care  Keep the area between the vagina and the anus (perineum) clean and dry as told by your health care provider. Use medicated pads and pain-relieving sprays and creams as directed.  If you had a cut in the perineum (episiotomy) or a tear in the vagina, check the area for signs of infection until you are healed. Check for: ? More redness, swelling, or pain. ? Fluid or blood coming from the cut or tear. ? Warmth. ? Pus or a bad  smell.  You may be given a squirt bottle to use instead of wiping to clean the perineum area after you go to the bathroom. As you start healing, you may use the squirt bottle before wiping yourself. Make sure to wipe gently.  To relieve pain caused by an episiotomy, a tear in the vagina, or swollen veins in the anus (hemorrhoids), try taking a warm sitz bath 2-3 times a day. A sitz bath is a warm water bath that is taken while you are sitting down. The water should only come up to your hips and should cover your buttocks. Breast care  Within the first few days after delivery, your breasts may feel heavy, full, and uncomfortable (breast engorgement). Milk may also leak from your breasts. Your health care provider can suggest ways to help relieve the discomfort. Breast engorgement should go away within a few days.  If you are breastfeeding: ? Wear a bra that supports your breasts and fits you well. ? Keep your nipples clean and dry. Apply creams and ointments as told by your health care provider. ? You may need to use breast pads to absorb milk that leaks from your breasts. ? You may have uterine contractions every time you breastfeed for up to several weeks after delivery. Uterine contractions help your uterus return to its normal size. ? If you have any problems with breastfeeding, work with your health care provider or Science writer.  If you are not breastfeeding: ? Avoid touching your breasts a lot. Doing this can make  your breasts produce more milk. ? Wear a good-fitting bra and use cold packs to help with swelling. ? Do not squeeze out (express) milk. This causes you to make more milk. Intimacy and sexuality  Ask your health care provider when you can engage in sexual activity. This may depend on: ? Your risk of infection. ? How fast you are healing. ? Your comfort and desire to engage in sexual activity.  You are able to get pregnant after delivery, even if you have not had  your period. If desired, talk with your health care provider about methods of birth control (contraception). Medicines  Take over-the-counter and prescription medicines only as told by your health care provider.  If you were prescribed an antibiotic medicine, take it as told by your health care provider. Do not stop taking the antibiotic even if you start to feel better. Activity  Gradually return to your normal activities as told by your health care provider. Ask your health care provider what activities are safe for you.  Rest as much as possible. Try to rest or take a nap while your baby is sleeping. Eating and drinking   Drink enough fluid to keep your urine pale yellow.  Eat high-fiber foods every day. These may help prevent or relieve constipation. High-fiber foods include: ? Whole grain cereals and breads. ? Brown rice. ? Beans. ? Fresh fruits and vegetables.  Do not try to lose weight quickly by cutting back on calories.  Take your prenatal vitamins until your postpartum checkup or until your health care provider tells you it is okay to stop. Lifestyle  Do not use any products that contain nicotine or tobacco, such as cigarettes and e-cigarettes. If you need help quitting, ask your health care provider.  Do not drink alcohol, especially if you are breastfeeding. General instructions  Keep all follow-up visits for you and your baby as told by your health care provider. Most women visit their health care provider for a postpartum checkup within the first 3-6 weeks after delivery. Contact a health care provider if:  You feel unable to cope with the changes that your child brings to your life, and these feelings do not go away.  You feel unusually sad or worried.  Your breasts become red, painful, or hard.  You have a fever.  You have trouble holding urine or keeping urine from leaking.  You have little or no interest in activities you used to enjoy.  You have not  breastfed at all and you have not had a menstrual period for 12 weeks after delivery.  You have stopped breastfeeding and you have not had a menstrual period for 12 weeks after you stopped breastfeeding.  You have questions about caring for yourself or your baby.  You pass a blood clot from your vagina. Get help right away if:  You have chest pain.  You have difficulty breathing.  You have sudden, severe leg pain.  You have severe pain or cramping in your lower abdomen.  You bleed from your vagina so much that you fill more than one sanitary pad in one hour. Bleeding should not be heavier than your heaviest period.  You develop a severe headache.  You faint.  You have blurred vision or spots in your vision.  You have bad-smelling vaginal discharge.  You have thoughts about hurting yourself or your baby. If you ever feel like you may hurt yourself or others, or have thoughts about taking your own life, get help  right away. You can go to the nearest emergency department or call:  Your local emergency services (911 in the [place]  A suicide crisis helpline, such as the [facility] at (786)261-7898. This is open 24 hours a day. Summary  The period of time right after you deliver your newborn up to 6-12 weeks after delivery is called the postpartum period.  Gradually return to your normal activities as told by your health care provider.  Keep all follow-up visits for you and your baby as told by your health care provider. This information is not intended to replace advice given to you by your health care provider. Make sure you discuss any questions you have with your health care provider. Document Revised: 06/05/2017 Document Reviewed: 03/16/2017 Elsevier Patient Education  [place].   Tubal Ligation Reversal, Care After This sheet gives you information about how to care for yourself after your procedure. Your health care provider may also  give you more specific instructions. If you have problems or questions, contact your health care provider. What can I expect after the procedure? After the procedure, it is common to have:  Mild abdominal discomfort. This can include: ? Mild cramping. ? Gas pains or feeling bloated. ? Pain or soreness at the incision areas.  Discomfort in the shoulder area. This is caused by air that is trapped between your liver and your diaphragm. The discomfort will slowly go away on its own.  Tiredness.  A sore throat if you had a breathing tube.  Nausea or vomiting. Follow these instructions at home: Medicines  Take over-the-counter and prescription medicines only as told by your health care provider.  Do not take aspirin because it can cause bleeding.  Do not drive or use heavy machinery while taking prescription pain medicine. Activity  Rest as told by your health care provider. Gradually return to your normal activities as told by your health care provider.  Avoid sitting for a long time without moving. Get up and move around one or more times every few hours.  Do not douche, use tampons, or have sexual intercourse for an entire menstrual cycle, or as told by your health care provider.  Do not lift anything that is heavier than 10 lb (4.5 kg), or as told by your health care provider.  Have someone help you with your daily household tasks for the first 7-10 days, or as recommended by your health care provider. Incision care   Follow instructions from your health care provider about how to take care of your incision. Make sure you: ? Wash your hands with soap and water before you change your bandage (dressing). If soap and water are not available, use hand sanitizer. ? Change your dressing as told by your health care provider. ? Leave stitches (sutures), skin glue, or adhesive strips in place. These skin closures may need to stay in place for 2 weeks or longer. If adhesive strip edges  start to loosen and curl up, you may trim the loose edges.  Do not take baths, swim, or use a hot tub until your health care provider approves. You may take showers.  Check your incision area every day for signs of infection. Check for: ? Redness, swelling, or pain. ? Fluid or blood. ? Warmth. ? Pus or a bad smell. General instructions  To prevent or treat constipation while you are taking prescription pain medicine, your health care provider may recommend that you: ? Drink enough fluid to keep your  urine pale yellow. ? Take over-the-counter or prescription medicines. ? Eat foods that are high in fiber, such as fresh fruits and vegetables, whole grains, and beans. ? Limit foods that are high in fat and processed sugars, such as fried and sweet foods.  Do not use any products that contain nicotine or tobacco, such as cigarettes and e-cigarettes. These can delay healing. If you need help quitting, ask your health care provider.  Keep all follow-up visits as told by your health care provider. This is important. You will need to have a follow-up X-ray dye test about 3-4 months after the surgery to make sure that your tubes are open. Contact a health care provider if:  You have signs of infection, such as: ? Redness, swelling, or pain around your incision sites. ? Fluid or blood coming from an incision. ? An incision that feels warm to the touch. ? Pus or a bad smell coming from an incision.  Your incision breaks open.  You have a fever.  You have a rash.  You feel dizzy or lightheaded.  You cannot eat or drink without vomiting.  You are constipated. Get help right away if:  You have shortness of breath or difficulty breathing.  You have chest or leg pain.  You repeatedly feel lightheaded.  You faint.  You have increasing pain in your abdomen.  You feel a burning sensation or pain when you urinate.  You have heavy vaginal bleeding when it is not time for your menstrual  period. Summary  It is common to have a sore throat, mild abdominal pain, and fatigue after the procedure.  Take over-the-counter and prescription medicines only as told by your health care provider.  Keep all follow-up visits as told by your health care provider. This is important. You will need to have a follow-up X-ray dye test about 3-4 months after the surgery to make sure that your tubes are open. This information is not intended to replace advice given to you by your health care provider. Make sure you discuss any questions you have with your health care provider. Document Revised: 05/15/2017 Document Reviewed: 09/11/2016 Elsevier Patient Education  [place].

## 2020-05-09 ENCOUNTER — Other Ambulatory Visit: Payer: Self-pay

## 2020-05-09 ENCOUNTER — Ambulatory Visit: Payer: Medicaid Other

## 2020-05-09 VITALS — BP 138/90 | HR 96

## 2020-05-09 DIAGNOSIS — Z0130 Encounter for examination of blood pressure without abnormal findings: Secondary | ICD-10-CM

## 2020-05-09 MED ORDER — NIFEDIPINE ER OSMOTIC RELEASE 30 MG PO TB24: ORAL_TABLET | ORAL | 2 refills | Status: AC

## 2020-05-09 NOTE — Progress Notes (Signed)
Subjective:  Chelsey Cook is a 34 y.o. female here for BP check.   Hypertension ROS: No medications at this time. No TIA's,chest pain or swelling in the ankles or hands at this time.     Objective:  LMP 08/16/2019   Appearance normal appearance and in no distress. General exam BP noted to be not well controlled today in office.    Assessment:   Blood Pressure  147/80 P 111, recheck at 138/90 p 96 Not controlled at this time  Plan: Per Dr. Rip Harbour will start you on Procardia XL 30 mg once per day. Recheck blood pressure in two weeks.

## 2020-05-15 ENCOUNTER — Encounter: Payer: Medicaid Other | Admitting: Licensed Clinical Social Worker

## 2020-05-15 ENCOUNTER — Telehealth: Payer: Self-pay | Admitting: Licensed Clinical Social Worker

## 2020-05-15 NOTE — Telephone Encounter (Signed)
Called Chelsey Cook regarding scheduled appt. Left message for callback

## 2020-05-21 ENCOUNTER — Ambulatory Visit (INDEPENDENT_AMBULATORY_CARE_PROVIDER_SITE_OTHER): Payer: Medicaid Other | Admitting: Licensed Clinical Social Worker

## 2020-05-21 DIAGNOSIS — F4322 Adjustment disorder with anxiety: Secondary | ICD-10-CM | POA: Diagnosis not present

## 2020-05-21 NOTE — BH Specialist Note (Signed)
Integrated Behavioral Health Follow Up In-Person Visit  MRN: [id number] Name: Chelsey Cook  Number of [place] Clinician visits: 4/6 Session Start time: 9:05am  Session End time: 9:21am Total time: 16 minutes via mychart   Types of Service: [place] (BHI)  Interpretor:Yes.   Interpretor Name and Language: none  Subjective: Chelsey Cook is a 34 y.o. female accompanied by n/a Patient was referred by Wende Bushy for adjustment disorder. Patient reports the following symptoms/concerns: worry, anxious mood, decrease sleep  Duration of problem: 4 months; Severity of problem: mild  Objective: Mood: good  and Affect: Appropriate Risk of harm to self or others: No plan to harm self or others  Life Context: Family and Social: Family is very supportive  School/Work: Attends school full time  Self-Care:  Life Changes: new pregnancy   Patient and/or Family's Strengths/Protective Factors: Concrete supports in place (healthy food, safe environments, etc.)  Goals Addressed: Patient will: 1.  Reduce symptoms of: anxiety  2.  Increase knowledge and/or ability of: healthy habits  3.  Demonstrate ability to: Increase healthy adjustment to current life circumstances  Progress towards Goals: Ongoing  Interventions: Interventions utilized:  Supportive Counseling Standardized Assessments completed: Edinburgh Postnatal Depression  Assessment: Patient currently experiencing adjustment disorder w anxious mood   Patient may benefit from integrated behavioral health   Plan: 1. Follow up with behavioral health clinician on : as needed  2. Behavioral recommendations: Continue to engage in self care, prioritize sleep and delegate task to prevent burnout  3. Referral(s): n/a 4. "From scale of 1-10, how likely are you to follow plan?":   Lynnea Ferrier, LCSW

## 2020-05-23 ENCOUNTER — Ambulatory Visit: Payer: Medicaid Other

## 2020-05-23 ENCOUNTER — Encounter: Payer: Self-pay | Admitting: General Practice

## 2020-06-05 ENCOUNTER — Other Ambulatory Visit (HOSPITAL_COMMUNITY)
Admission: RE | Admit: 2020-06-05 | Discharge: 2020-06-05 | Disposition: A | Discharge status: Home / Self Care | Payer: Medicaid Other | Source: Ambulatory Visit

## 2020-06-05 ENCOUNTER — Other Ambulatory Visit: Payer: Self-pay

## 2020-06-05 ENCOUNTER — Ambulatory Visit (INDEPENDENT_AMBULATORY_CARE_PROVIDER_SITE_OTHER): Payer: Medicaid Other

## 2020-06-05 DIAGNOSIS — Z9851 Tubal ligation status: Secondary | ICD-10-CM

## 2020-06-05 DIAGNOSIS — Z124 Encounter for screening for malignant neoplasm of cervix: Secondary | ICD-10-CM

## 2020-06-05 NOTE — Progress Notes (Signed)
..   Post Partum Visit Note  Shona I [place] is a 34 y.o. [phone] female who presents for a postpartum visit. She is 4 weeks postpartum following a normal spontaneous vaginal delivery.  I have fully reviewed the prenatal and intrapartum course. The delivery was at 37.1 gestational weeks.  Anesthesia: epidural. Postpartum course has been good. Baby is doing well. Baby is feeding by breast, pumping. Bleeding no bleeding. Bowel function is abnormal: constipation. Bladder function is normal. Patient is sexually active. Contraception method is tubal ligation. Postpartum depression screening: negative.   Patient states she is taking fiber gummy bears for constipation.  She thinks her diet is the major contributor.  She admits to inadequate water intake. Patient reports she receives support from [name].  She endorses safety at home and denies DV/A.  Patient reports history of abnormal pap in Jan 2020 with colposcopy that was normal.  Patient agreeable to pap smear today.  The pregnancy intention screening data noted above was reviewed. Potential methods of contraception were discussed. The patient elected to proceed with Female Sterilization.    Edinburgh Postnatal Depression Scale - 06/05/20 1032      Edinburgh Postnatal Depression Scale:  In the Past 7 Days   I have been able to laugh and see the funny side of things. 0    I have looked forward with enjoyment to things. 0    I have blamed myself unnecessarily when things went wrong. 0    I have been anxious or worried for no good reason. 0    I have felt scared or panicky for no good reason. 0    Things have been getting on top of me. 0    I have been so unhappy that I have had difficulty sleeping. 0    I have felt sad or miserable. 0    I have been so unhappy that I have been crying. 0    The thought of harming myself has occurred to me. 0    Edinburgh Postnatal Depression Scale Total 0            The following portions of the patient's  history were reviewed and updated as appropriate: allergies, current medications, past family history, past medical history, past social history, past surgical history and problem list.  Review of Systems Pertinent items are noted in HPI.    Objective:  BP 129/76   Pulse (!) 153   Ht 5\' 3"  (1.6 m)   Wt 155 lb 12.8 oz (70.7 kg)   LMP 08/16/2019   Breastfeeding Yes   BMI 27.60 kg/m    General:  alert, cooperative and appears fatigued.   Breasts:  Not Examined  Lungs: No respiratory distress.    Heart:  Tachycardia  Abdomen: Soft, NT   Vulva:  normal  Vagina: vagina negative for vaginal tear/laceration Scant amt discharge.   Cervix:  multiparous appearance and no bleeding following Pap  Corpus: not examined  Adnexa:  not evaluated  Rectal Exam: Not performed.        Assessment:   4 week postpartum exam.  Pap smear done at today's visit.  S/P BTL  Plan:   Okay to return to normal activities. Discussed pap smear and results notification.  Essential components of care per ACOG recommendations:  1.  Mood and well being: Patient with negative depression screening today. Reviewed local resources for support.  - Patient does not use tobacco.  - hx of drug use? No    2.  Infant care and feeding:  -Patient currently breastmilk feeding? Yes Discussed regular pumping to promote lactation.  -Social determinants of health (SDOH) reviewed in EPIC. No concerns   3. Sexuality, contraception and birth spacing - Patient does not want a pregnancy in the next year.  Desired family size is 2 children.  - Reviewed forms of contraception in tiered fashion. Patient desired bilateral tubal ligation today.   - Discussed birth spacing of 18 months  4. Sleep and fatigue -Encouraged family/partner/community support of 4 hrs of uninterrupted sleep to help with mood and fatigue.  Patient states she is getting 4 hours considering baby sleep schedule is off.  5. Physical Recovery  - Discussed  patients delivery and complications. Patient states "it was a piece of cake...two pushes and he was out!"  - Patient had a no degree laceration, perineal healing reviewed. Patient expressed understanding - Patient has urinary incontinence? No - Patient is safe to resume physical and sexual activity  6.  Health Maintenance - Last pap smear done today and is pending. No Mammogram  7. Chronic Disease -Discussed tachycardia and need for follow up with PCP. -Encouraged to schedule.    Maryann Conners, [place] for [employer], [place]

## 2020-06-12 ENCOUNTER — Encounter: Payer: Self-pay | Admitting: Nurse Practitioner

## 2020-06-12 DIAGNOSIS — R87610 Atypical squamous cells of undetermined significance on cytologic smear of cervix (ASC-US): Secondary | ICD-10-CM | POA: Insufficient documentation

## 2020-06-12 LAB — CYTOLOGY - PAP
Comment: NEGATIVE
Comment: NEGATIVE
Diagnosis: UNDETERMINED — AB
HPV 16: NEGATIVE
HPV 18 / 45: NEGATIVE
High risk HPV: POSITIVE — AB

## 2021-09-22 IMAGING — US US MFM OB DETAIL+14 WK
1 series · 13 of 28 positions shown · non-contrast
Comparison: none

[Series 1: us mfm ob detail+14 wk · 13 of 94 slices shown]
[im 4/94]
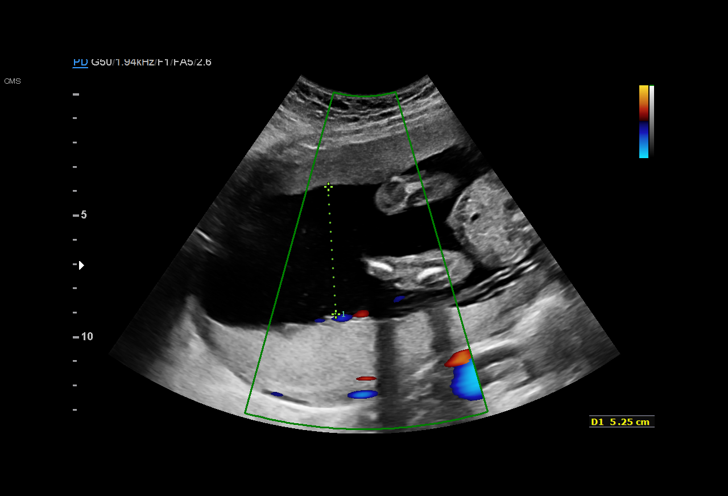
[im 11/94]
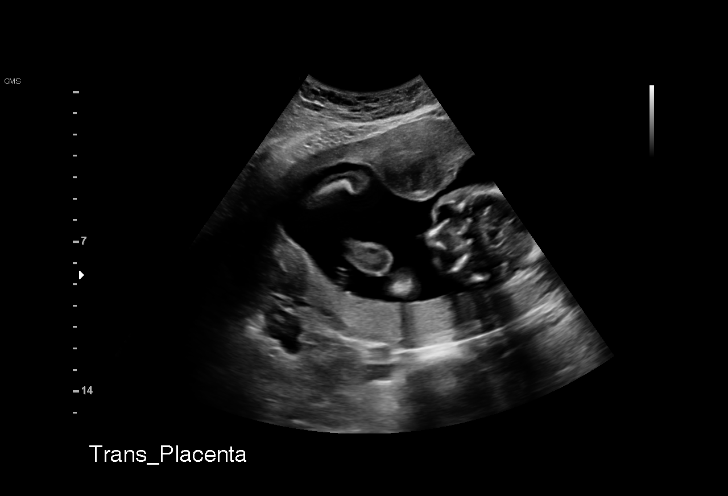
[im 18/94]
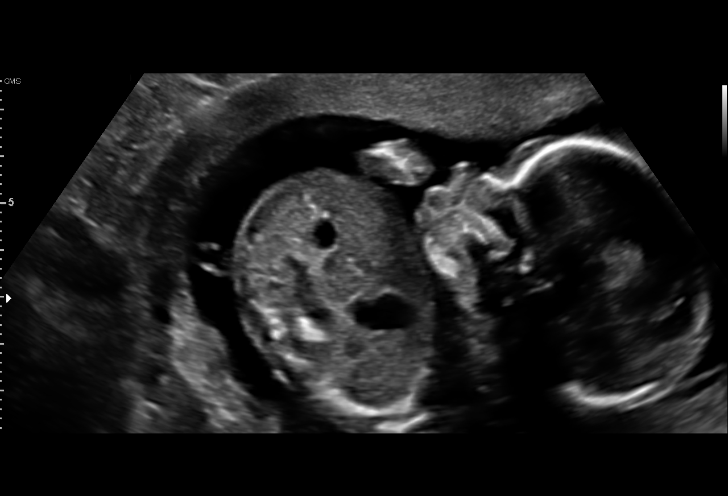
[im 25/94]
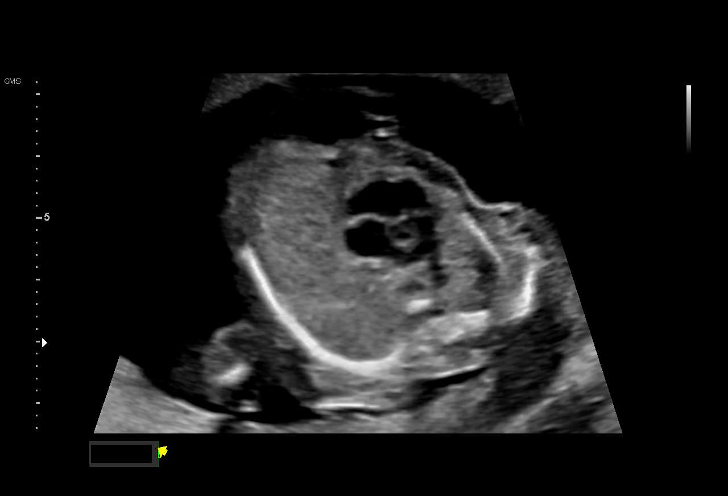
[im 32/94]
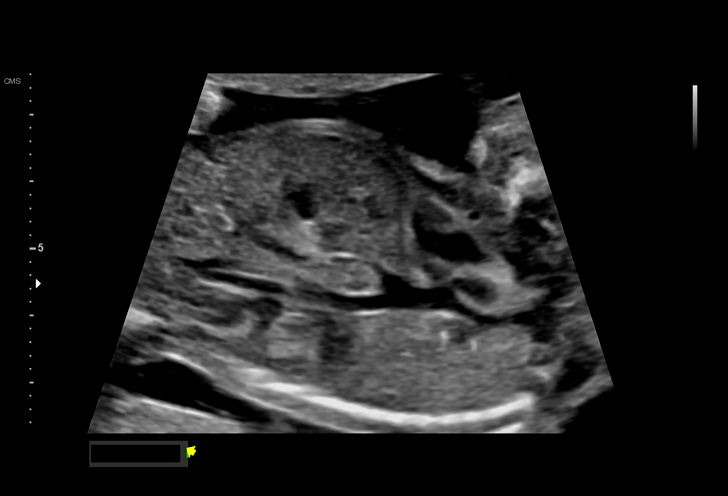
[im 38/94]
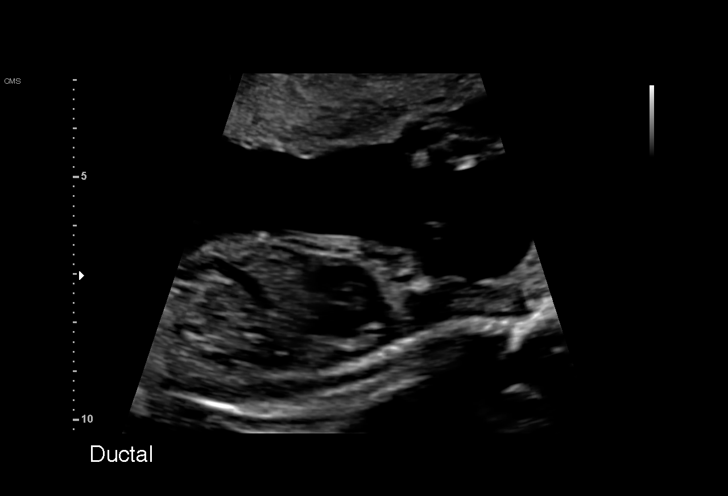
[im 49/94]
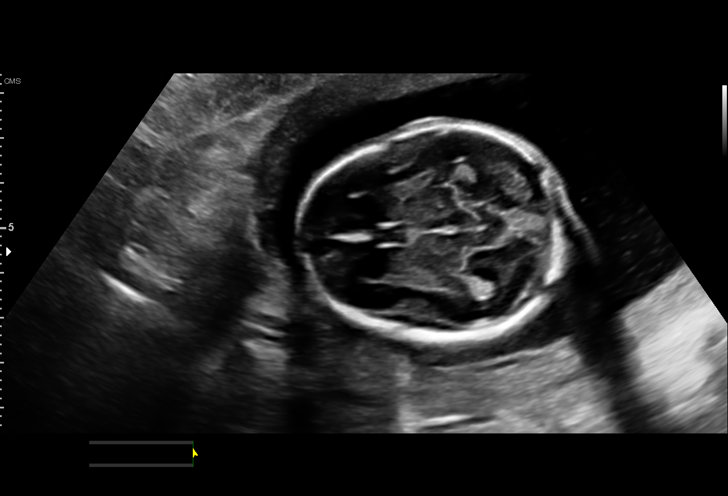
[im 56/94]
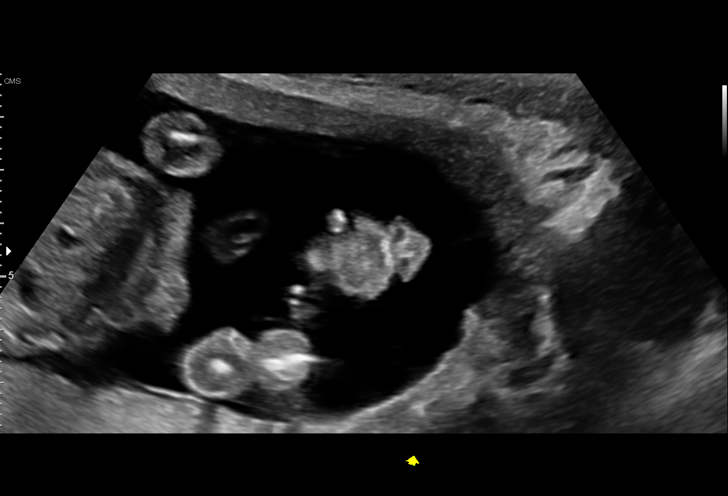
[im 63/94]
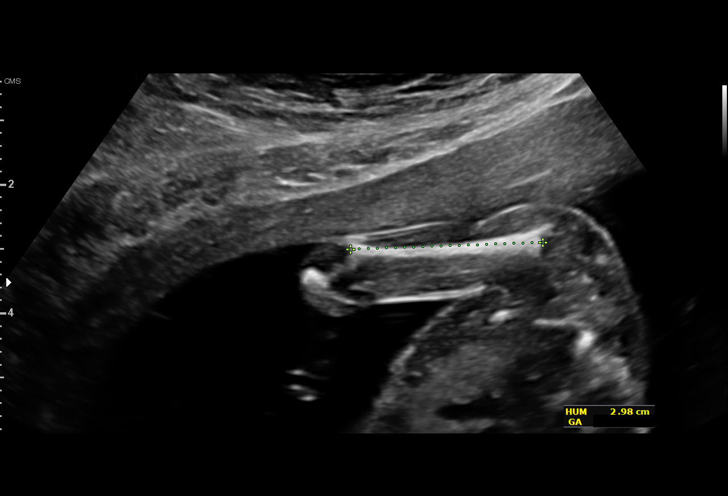
[im 69/94]
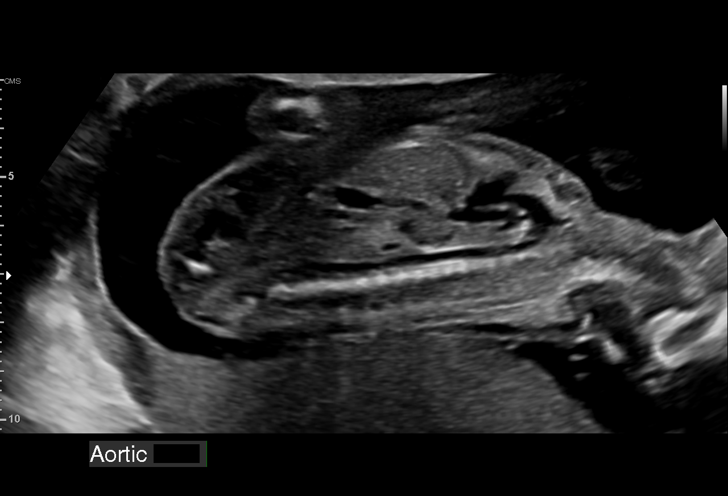
[im 76/94]
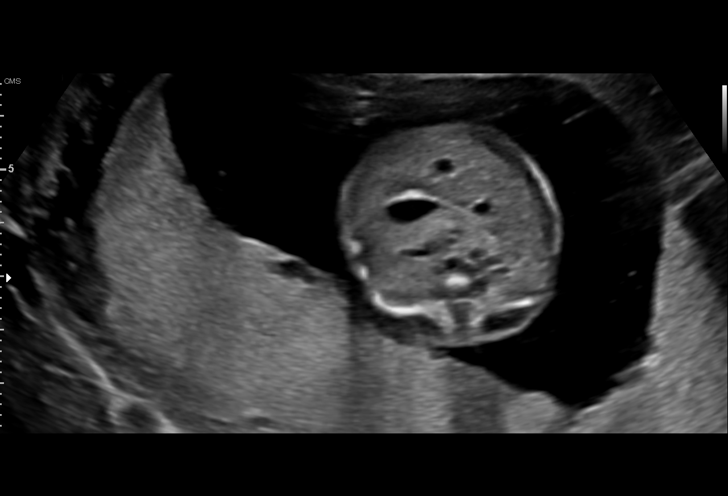
[im 83/94]
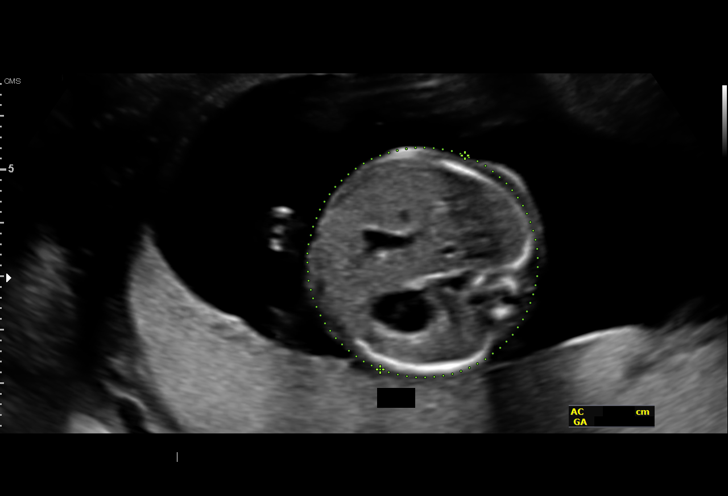
[im 90/94]
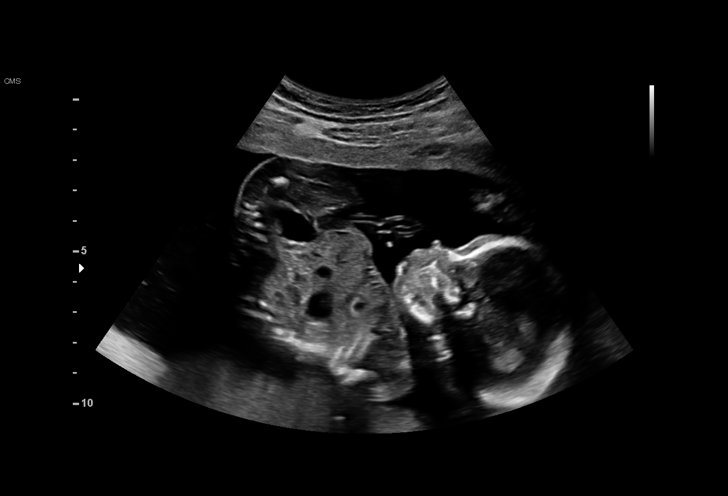

[13 of 28 positions shown; findings below may reference images not displayed]

[REDACTED]. [HOSPITAL],
                   MESERET CNM

Indications

 Antenatal screening for malformations
 Advanced maternal age multigravida 35+,
 second trimester (low risk NIPS)
 Genetic carrier (SMA, Fragile X -
 intermediate)
 Uterine fibroids affecting pregnancy in        O34.12,
 second trimester, antepartum
 19 weeks gestation of pregnancy
Fetal Evaluation

 Num Of Fetuses:         1
 Fetal Heart Rate(bpm):  159
 Cardiac Activity:       Observed
 Presentation:           Transverse, head to maternal left
 Placenta:               Posterior
 P. Cord Insertion:      Visualized

 Amniotic Fluid
 AFI FV:      Within normal limits

                             Largest Pocket(cm)

Biometry

 BPD:      46.2  mm     G. Age:  20w 0d         78  %    CI:        70.04   %    70 - 86
                                                         FL/HC:      16.7   %    16.1 -
 HC:      176.1  mm     G. Age:  20w 1d         80  %    HC/AC:      1.30        1.09 -
 AC:      135.5  mm     G. Age:  19w 0d         36  %    FL/BPD:     63.6   %
 FL:       29.4  mm     G. Age:  19w 0d         34  %    FL/AC:      21.7   %    20 - 24
 HUM:      29.8  mm     G. Age:  19w 6d         64  %
 CER:      20.5  mm     G. Age:  19w 5d         65  %
 NFT:       4.5  mm
 CM:        4.5  mm

 Est. FW:     277  gm    0 lb 10 oz      38  %
OB History

 Gravidity:    5         Term:   1        Prem:   0        SAB:   3
 TOP:          0       Ectopic:  0        Living: 1
Gestational Age

 LMP:           19w 2d        Date:  08/16/19                 EDD:   05/22/20
 U/S Today:     19w 4d                                        EDD:   05/20/20
 Best:          19w 2d     Det. By:  LMP  (08/16/19)          EDD:   05/22/20
Anatomy

 Cranium:               Appears normal         LVOT:                   Appears normal
 Cavum:                 Appears normal         Aortic Arch:            Appears normal
 Ventricles:            Appears normal         Ductal Arch:            Appears normal
 Choroid Plexus:        Appears normal         Diaphragm:              Appears normal
 Cerebellum:            Appears normal         Stomach:                Appears normal, left
                                                                       sided
 Posterior Fossa:       Appears normal         Abdomen:                Appears normal
 Nuchal Fold:           Appears normal         Abdominal Wall:         Appears nml (cord
                                                                       insert, abd wall)
 Face:                  Appears normal         Cord Vessels:           Appears normal (3
                        (orbits and profile)                           vessel cord)
 Lips:                  Appears normal         Kidneys:                Appear normal
 Palate:                Appears normal         Bladder:                Appears normal
 Thoracic:              Appears normal         Spine:                  Appears normal
 Heart:                 Normal;                Upper Extremities:      Appears normal
                        Echogenic focus
                        in LV
 RVOT:                  Appears normal         Lower Extremities:      Appears normal

 Other:  Heels/feet and open hands/5th digits visualized.
Cervix Uterus Adnexa

 Cervix
 Length:           3.46  cm.
 Normal appearance by transabdominal scan.

 Uterus
 Multiple fibroids noted, see table below.

 Right Ovary
 Within normal limits.

 Left Ovary
 Within normal limits.
 Cul De Sac
 No free fluid seen.

 Adnexa
 No abnormality visualized.
Myomas

 Site                     L(cm)      W(cm)      D(cm)       Location
 Anterior
 Anterior
 Left

 Blood Flow                  RI       PI       Comments

Impression

 We performed fetal anatomy scan. An echogenic intracardiac
 focus is seen. No other makers of aneuploidies or fetal
 structural defects are seen. Fetal biometry is consistent with
 her previously-established dates. Amniotic fluid is normal and
 good fetal activity is seen.
 Multiple small intramural myomas are seen (measurements
 above). She does not have symptoms pertaining to the
 myomas.
 I informed the patient that given that she had Addiskidan Hess for fetal
 aneuploidies on cell-free fetal DNA screening, finding of
 echogenic intracardiac focus should be considered a normal
 variant and that the risk of trisomy 21 is not increased. I also
 reassured that echogenic focus does not increase the risk of
 cardiac defects. I also informed her that only amniocentesis
 will give a defintive result on the fetal karyotype.
 Patient opted not to have amniocentesis.
Recommendations

 -An appointment was made for her to return in 8 weeks for
 fetal growth assessment and to evaluate the myomas.
                 Huq, Jeanny

## 2021-11-17 IMAGING — US US MFM OB FOLLOW-UP
1 series · 13 of 28 positions shown · non-contrast
Comparison: none

[Series 1: us mfm ob follow-up · 13 of 38 slices shown]
[im 2/38]
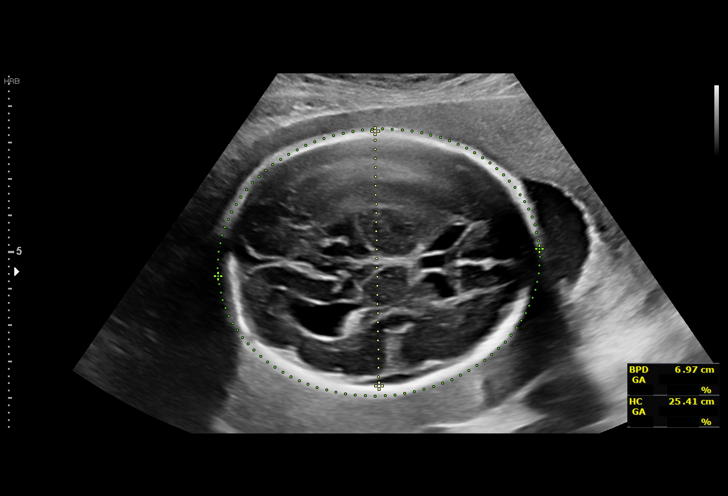
[im 5/38]
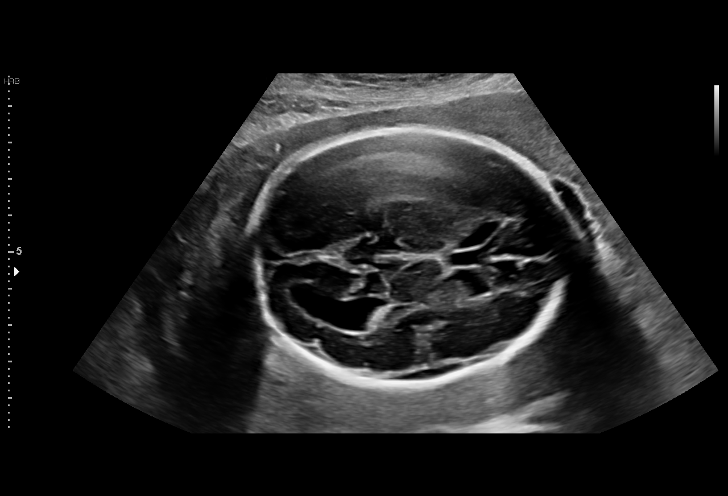
[im 7/38]
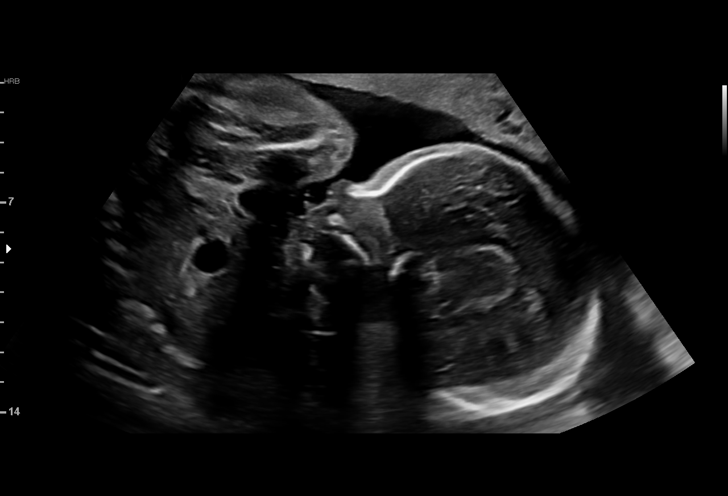
[im 10/38]
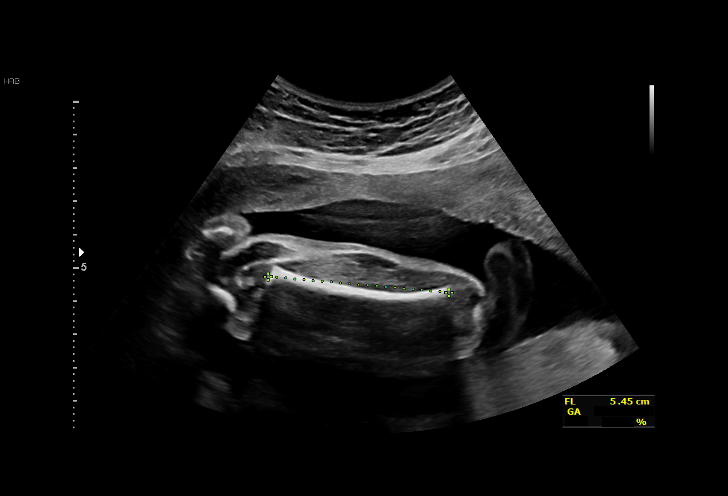
[im 13/38]
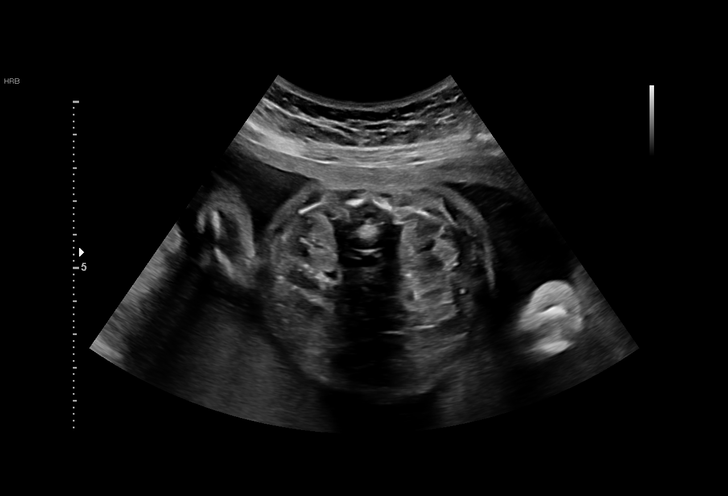
[im 16/38]
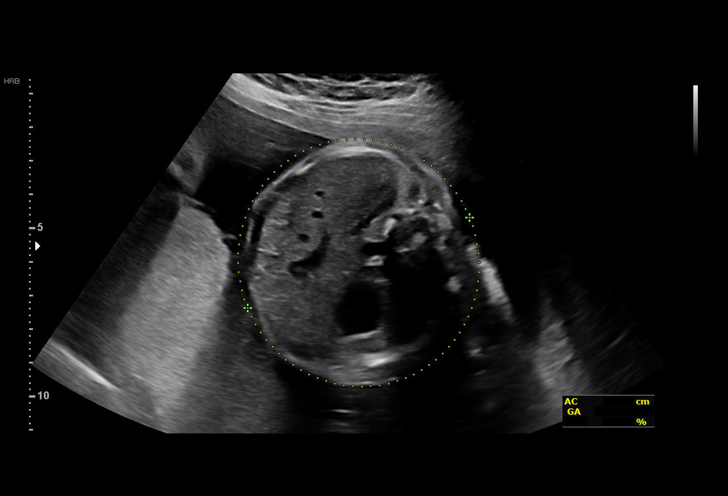
[im 20/38]
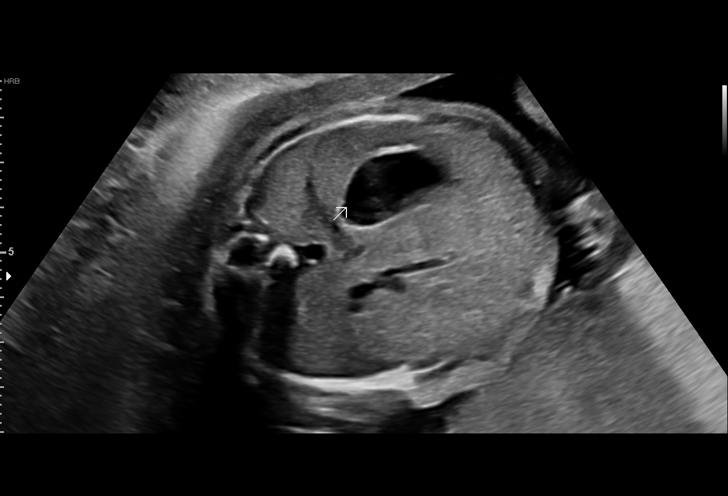
[im 22/38]
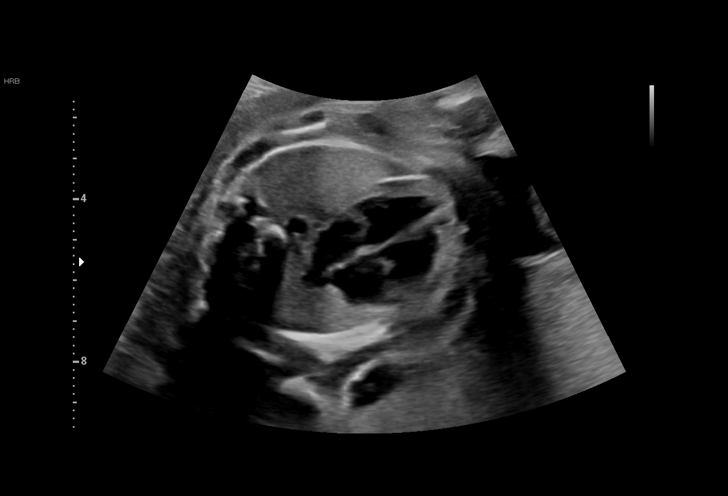
[im 25/38]
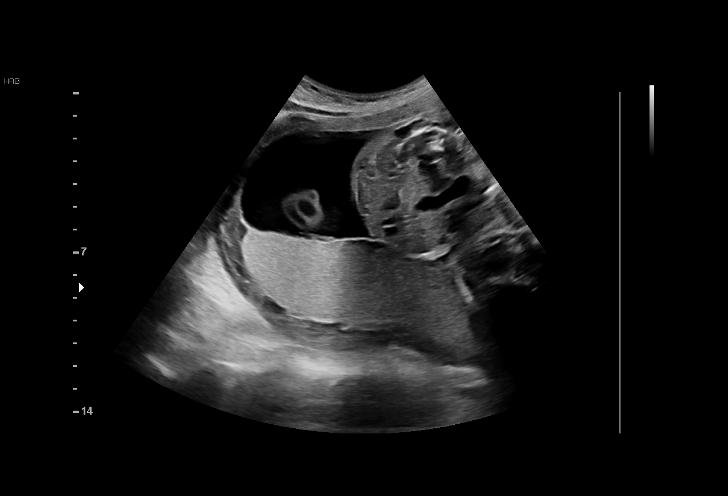
[im 28/38]
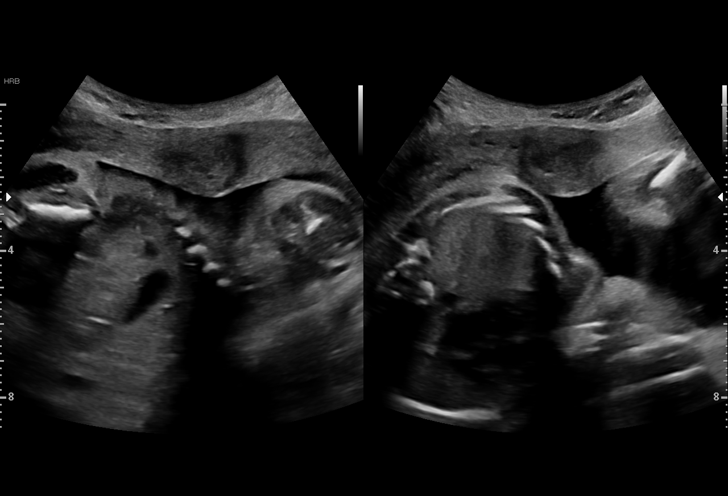
[im 31/38]
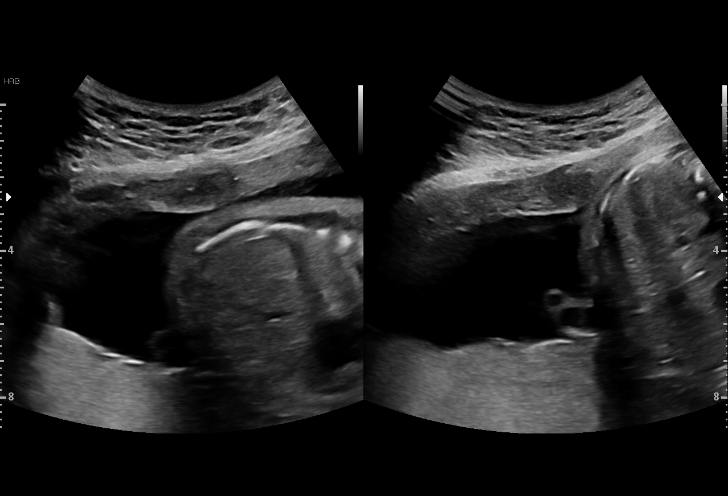
[im 33/38]
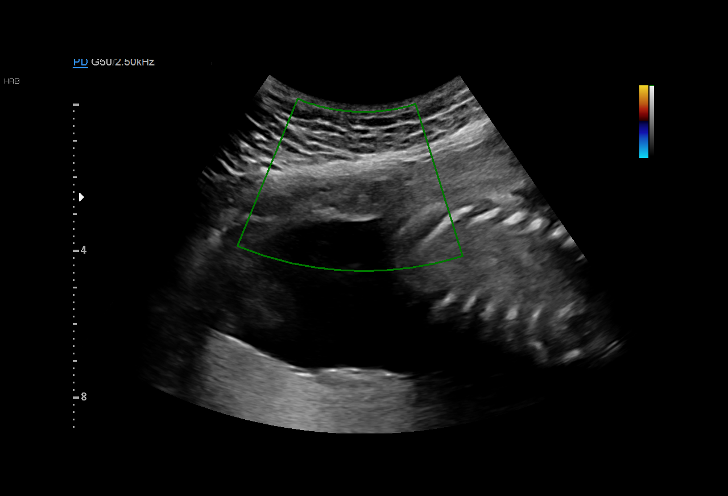
[im 36/38]
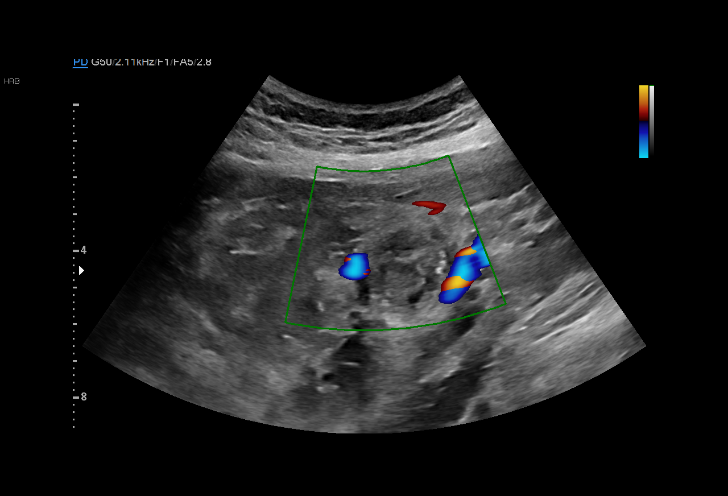

[13 of 28 positions shown; findings below may reference images not displayed]

[REDACTED]. [HOSPITAL],
                   KLPIGBB CNM

Indications

 Uterine fibroids affecting pregnancy in         O34.12,
 second trimester, antepartum
 Advanced maternal age multigravida 35+,
 second trimester (low risk NIPS)
 Genetic carrier (SMA, Fragile X -
 intermediate)
 Uterine fibroids affecting pregnancy in         O34.12,
 second trimester, antepartum
 27 weeks gestation of pregnancy
Vital Signs

 (lb):
 BMI:
Fetal Evaluation

 Num Of Fetuses:          1
 Fetal Heart              159
 Rate(bpm):
 Cardiac Activity:        Observed
 Presentation:            Cephalic
 Placenta:                Posterior
 P. Cord Insertion:       Previously Visualized
 Amniotic Fluid
 AFI FV:      Within normal limits
Biometry

 BPD:      69.2  mm     G. Age:  27w 6d         57  %    CI:         75.42  %    70 - 86
                                                         FL/HC:       21.1  %    18.6 -
 HC:      252.7  mm     G. Age:  27w 3d         26  %    HC/AC:       1.10       1.05 -
 AC:      229.4  mm     G. Age:  27w 2d         42  %    FL/BPD:      77.2  %    71 - 87
 FL:       53.4  mm     G. Age:  28w 2d         67  %    FL/AC:       23.3  %    20 - 24

 LV:        7.9  mm

 Est. FW:    3337   g      2 lb 7 oz     55  %
                    m
OB History

 Gravidity:    5         Term:   1        Prem:   0         SAB:   3
 TOP:          0       Ectopic:  0        Living: 1
Gestational Age

 LMP:           27w 2d        Date:  08/16/19                 EDD:    05/22/20
 U/S Today:     27w 5d                                        EDD:    05/19/20
 Best:          27w 2d     Det. By:  LMP  (08/16/19)          EDD:    05/22/20
Anatomy

 Cranium:               Appears normal         LVOT:                   Previously seen
 Cavum:                 Appears normal         Aortic Arch:            Previously seen
 Ventricles:            Appears normal         Ductal Arch:            Previously seen
 Choroid Plexus:        Previously seen        Diaphragm:              Appears normal
 Cerebellum:            Previously seen        Stomach:                Appears normal,
                                                                       left sided
 Posterior Fossa:       Previously seen        Abdomen:                Appears normal
 Nuchal Fold:           Previously seen        Abdominal Wall:         Previously seen
 Face:                  Orbits and profile     Cord Vessels:           Previously seen
                        previously seen
 Lips:                  Previously seen        Kidneys:                Appear normal
 Palate:                Previously seen        Bladder:                Appears normal
 Thoracic:              Appears normal         Spine:                  Previously seen
 Heart:                 Appears normal;        Upper Extremities:      Previously seen
                        EIF
 RVOT:                  Previously seen        Lower Extremities:      Previously seen

 Other:  Heels/feet and open hands/5th digits previously visualized.
Cervix Uterus Adnexa

 Cervix
 Not visualized (advanced GA >90wks)
Myomas

 Site                     L(cm)      W(cm)       D(cm)      Location
 Anterior
 Anterior                 2.3        1
 Left                     2.6        2.2         2

 Blood Flow                  RI       PI       Comments

Impression

 Follow up growth and anatomy
 Normal interval growth with measurements consistent with
 dates
 Good fetal movement and amniotic fluid volume
 Anatomy now complete.
Recommendations

 Follow up growth as clinically indicated.

## 2022-01-13 IMAGING — US US MFM FETAL BPP W/O NON-STRESS
1 series · 16 of 28 positions shown · non-contrast
Comparison: none

[Series 1: us mfm fetal bpp w/o non-stress · 31 acquisitions, 16 frames shown]
[im 1/31]
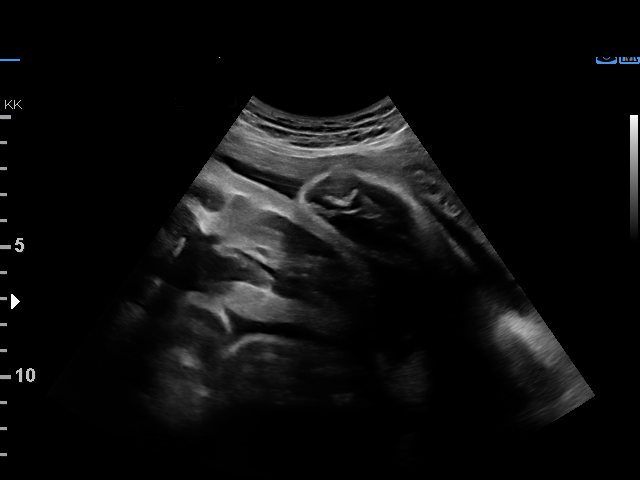
[im 3/31]
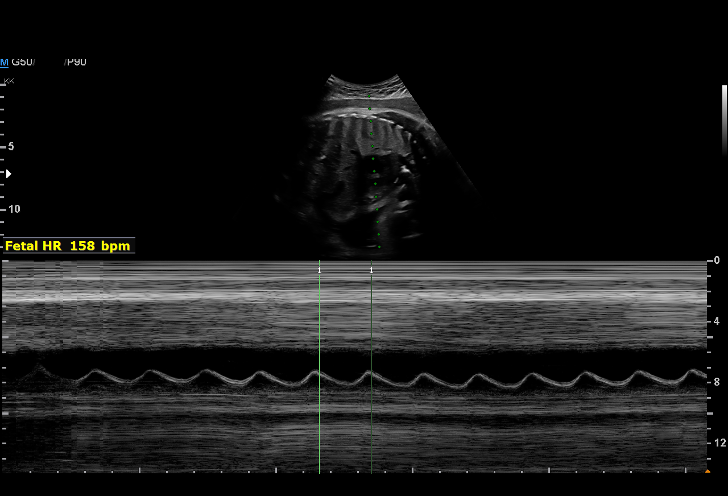
[im 5/31]
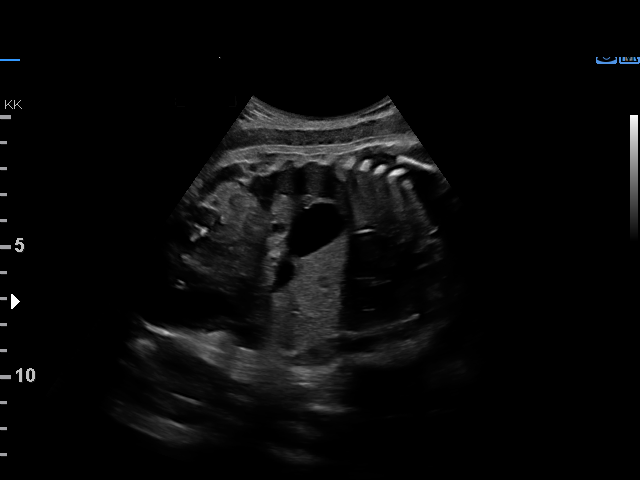
[im 7/31]
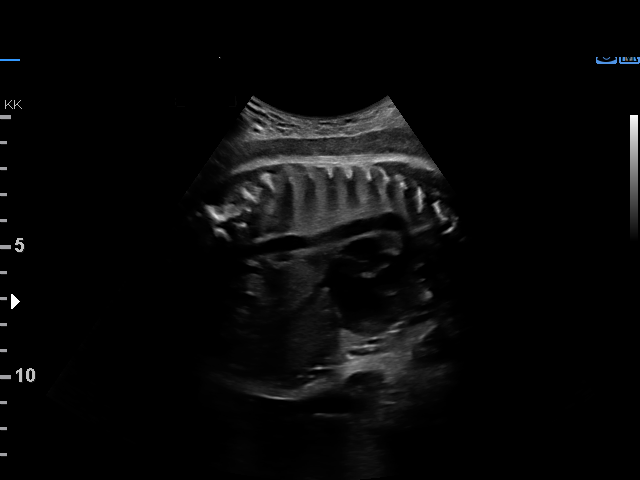
[im 8/31]
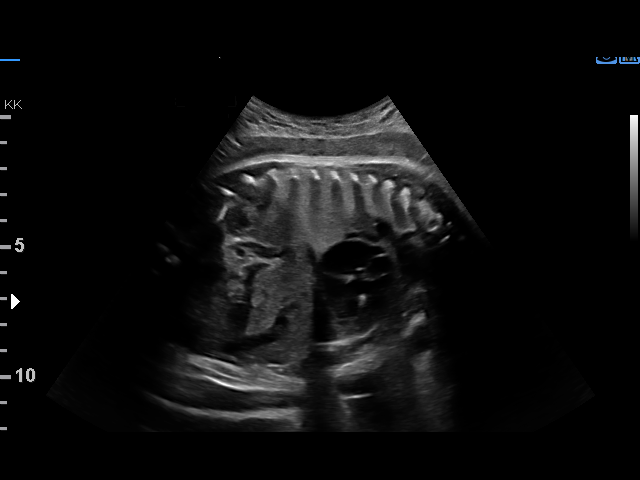
[im 11/31]
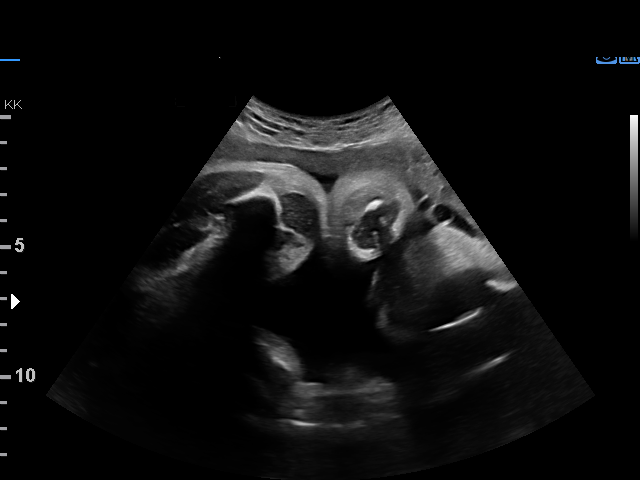
[im 13/31]
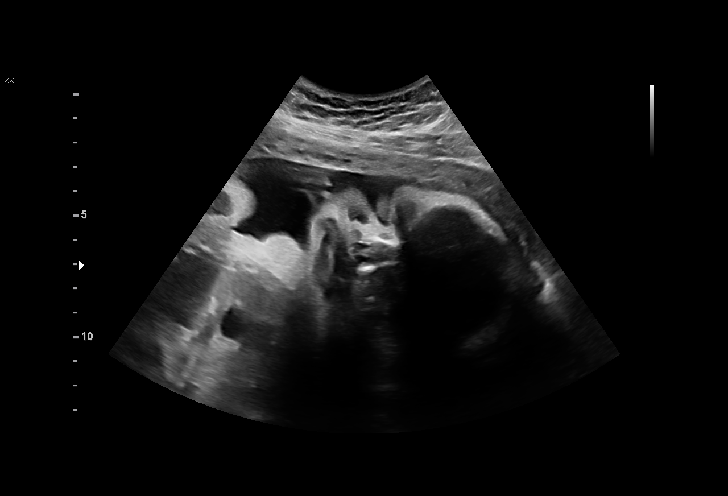
[im 15/31]
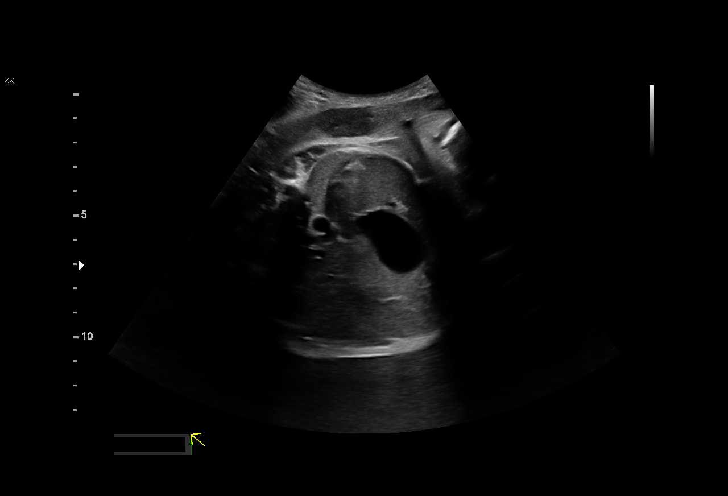
[im 16/31]
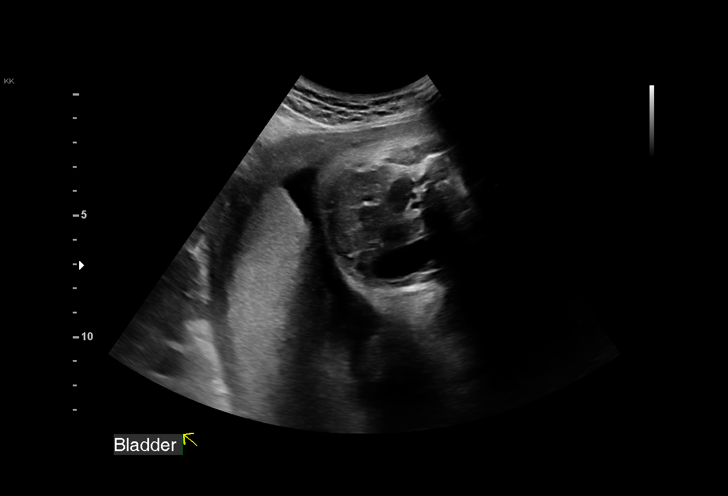
[im 18/31]
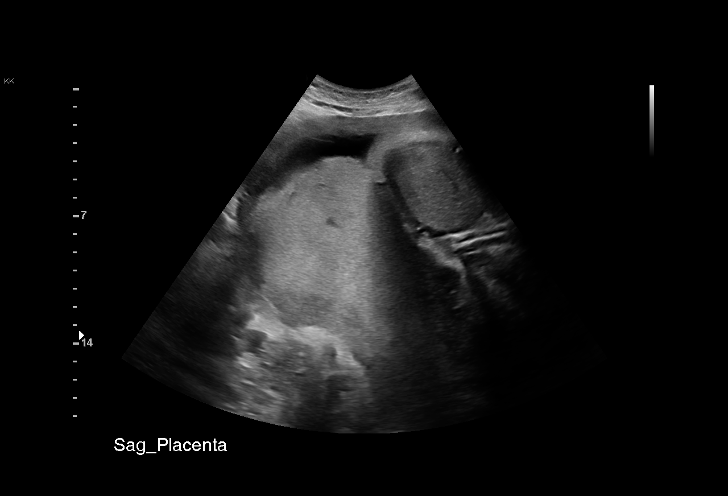
[im 21/31]
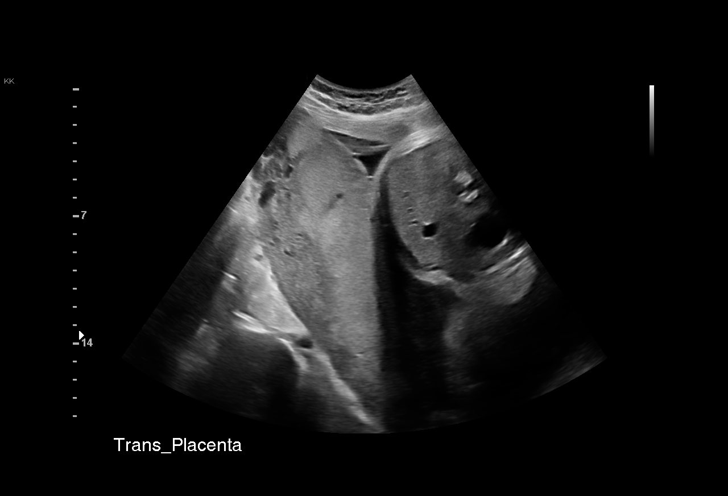
[im 23/31]
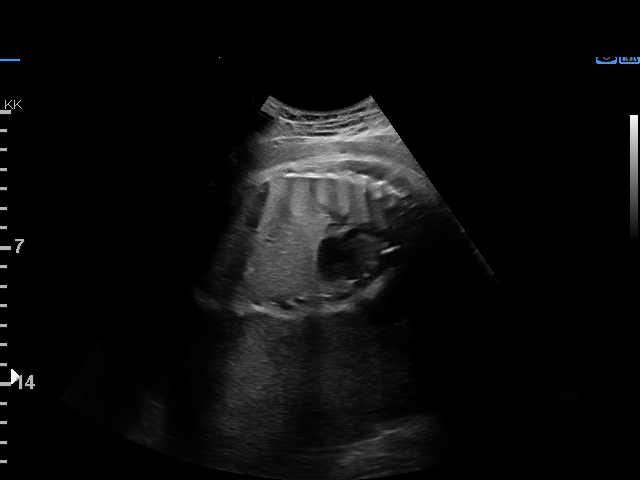
[im 24/31]
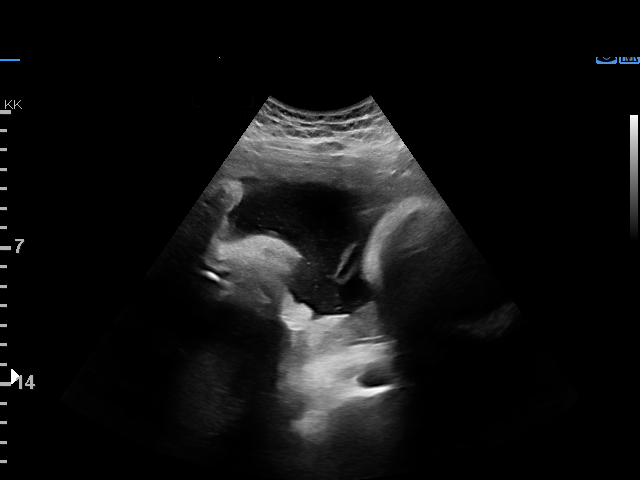
[im 26/31]
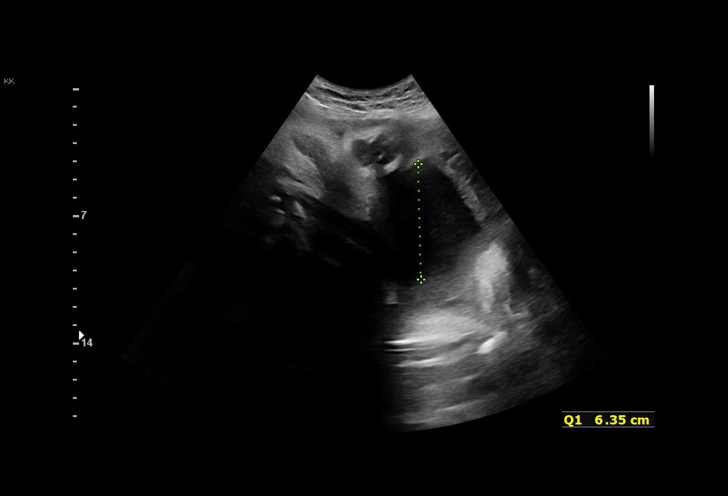
[im 28/31]
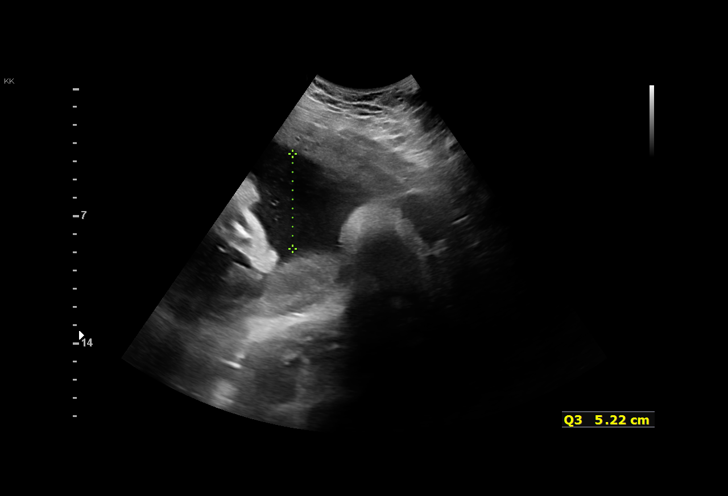
[im 31/31]
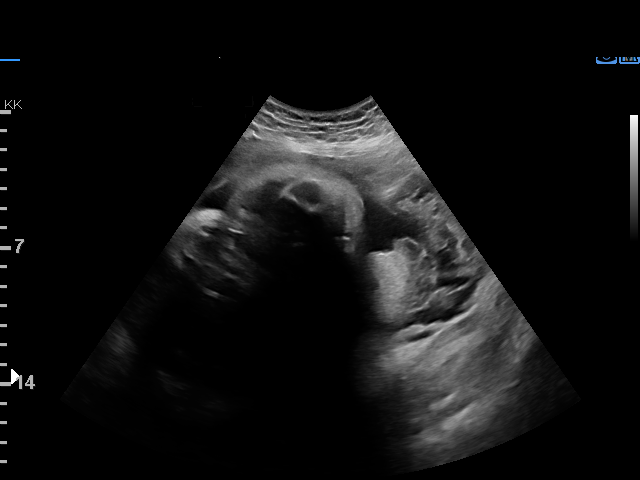

[16 of 28 positions shown; findings below may reference images not displayed]

[REDACTED]. [HOSPITAL],
                   [facility] CNM

Indications

 Hypertension - Gestational
 Uterine fibroids affecting pregnancy in third  O34.13,
 trimester, antepartum
 Advanced maternal age multigravida 35+,
 third trimester
 Genetic carrier (SMA, Fragile X -
 intermediate)
 35 weeks gestation of pregnancy
Vital Signs

                                                Height:        5'3"
Fetal Evaluation

 Num Of Fetuses:         1
 Fetal Heart Rate(bpm):  158
 Cardiac Activity:       Observed
 Presentation:           Cephalic
 Placenta:               Posterior
 P. Cord Insertion:      Previously Visualized

 Amniotic Fluid
 AFI FV:      Within normal limits

 AFI Sum(cm)     %Tile       Largest Pocket(cm)
 16.82           62
 RUQ(cm)       RLQ(cm)       LUQ(cm)        LLQ(cm)

Biophysical Evaluation

 Amniotic F.V:   Pocket => 2 cm             F. Tone:        Observed
 F. Movement:    Observed                   Score:          [DATE]
 F. Breathing:   Observed
OB History

 Gravidity:    5         Term:   1        Prem:   0        SAB:   3
 TOP:          0       Ectopic:  0        Living: 1
Gestational Age

 LMP:           35w 3d        Date:  08/16/19                 EDD:   05/22/20
 Best:          35w 3d     Det. By:  LMP  (08/16/19)          EDD:   05/22/20
Anatomy

 Stomach:               Visualized             Bladder:                Visualized
Impression

 Gestational hypertension.
 Amniotic fluid is normal and good fetal activity is seen
 .Antenatal testing is reassuring. BPP [DATE]. Cephalic
 presentation.
Recommendations

 BPP next week.
 Delivery at 37 weeks.
                 Quiroa, Autumn

## 2022-01-20 IMAGING — US US MFM OB FOLLOW-UP
1 series · 14 of 28 positions shown · non-contrast
Comparison: none

[Series 1: us mfm ob follow-up · 52 acquisitions, 14 frames shown]
[im 2/52]
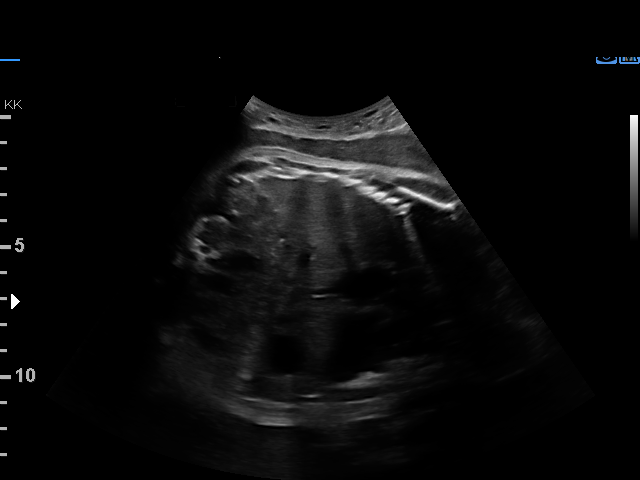
[im 6/52]
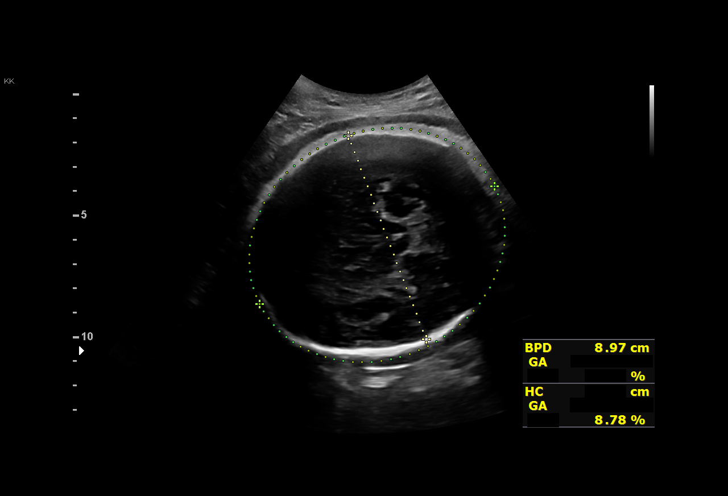
[im 10/52]
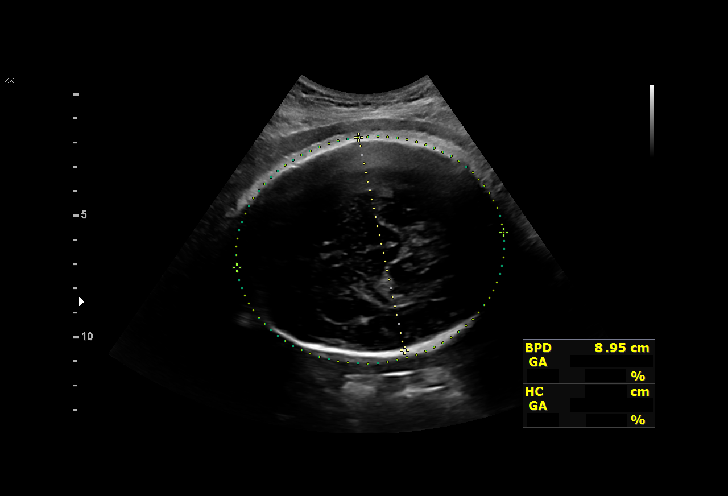
[im 14/52]
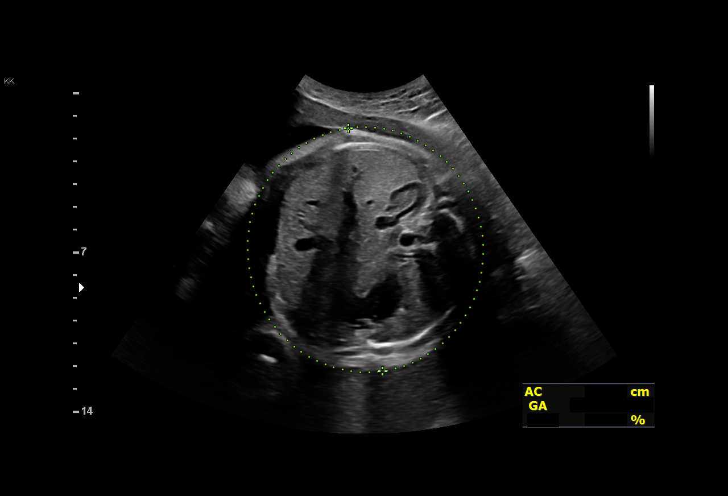
[im 18/52]
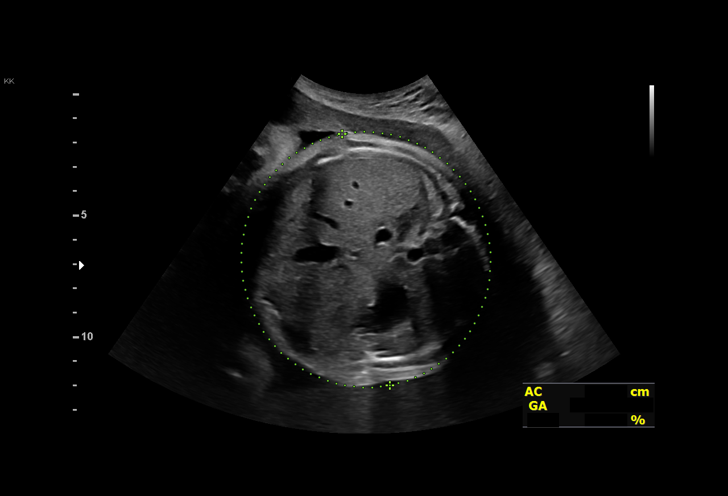
[im 21/52]
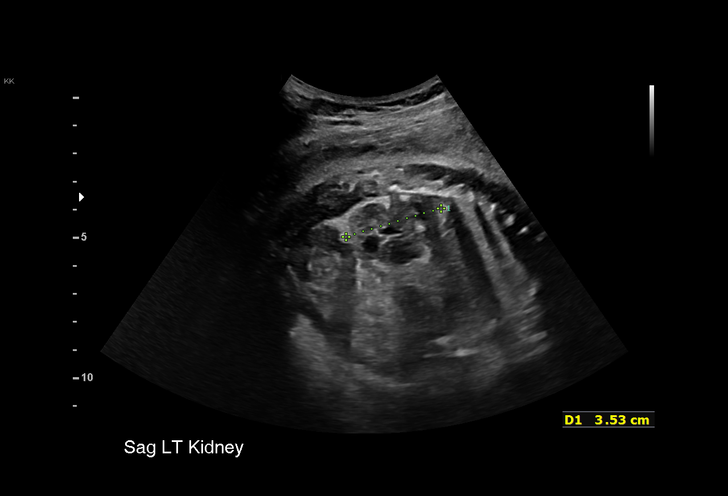
[im 25/52]
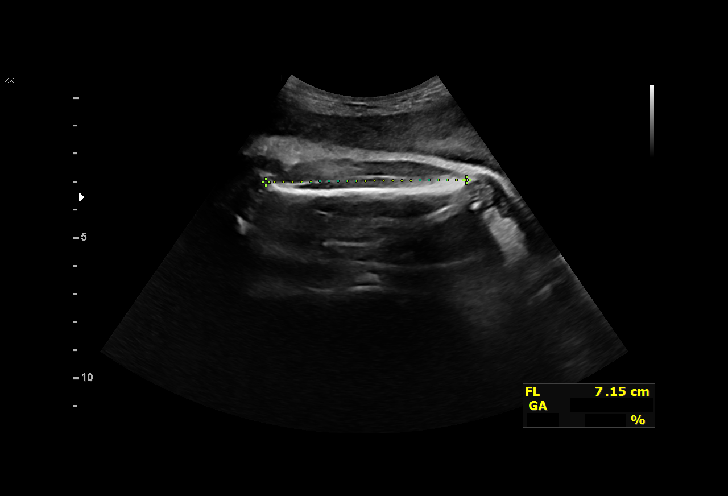
[im 29/52]
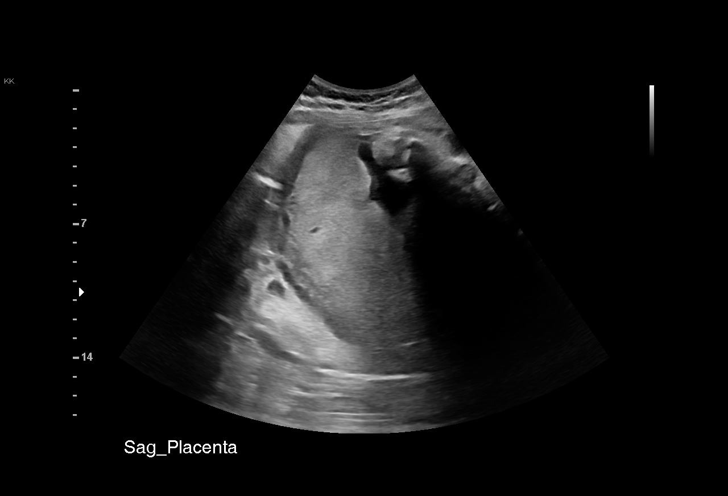
[im 33/52]
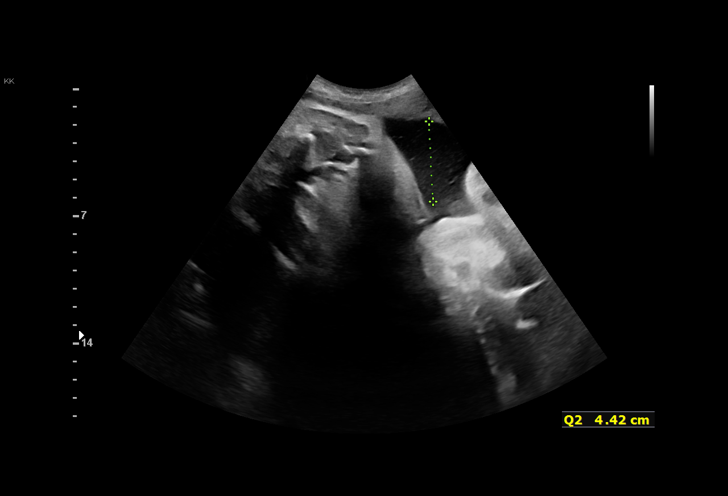
[im 36/52]
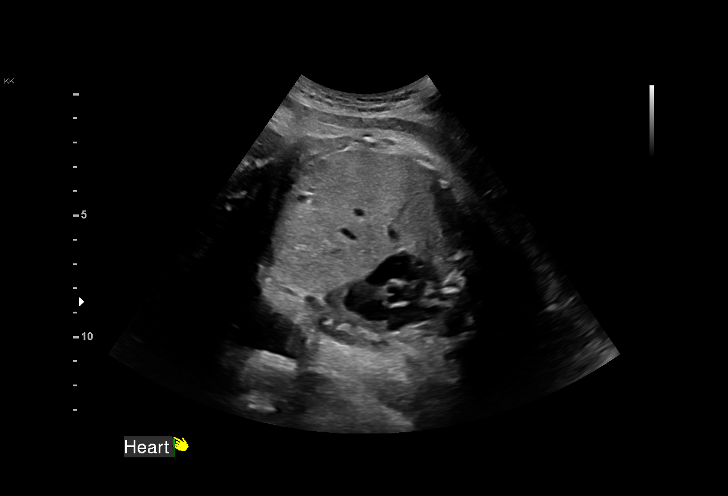
[im 40/52]
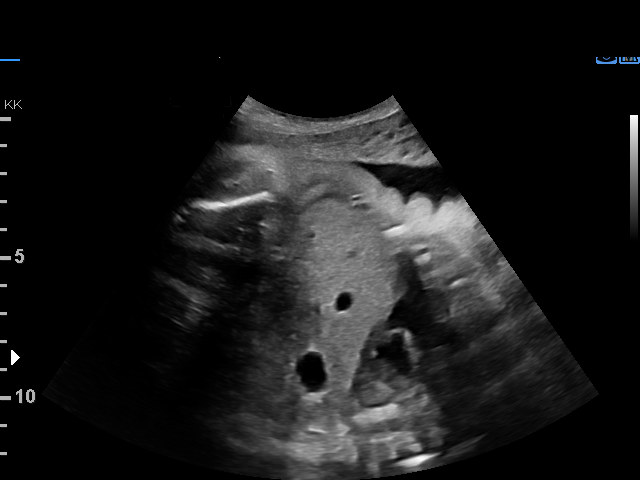
[im 44/52]
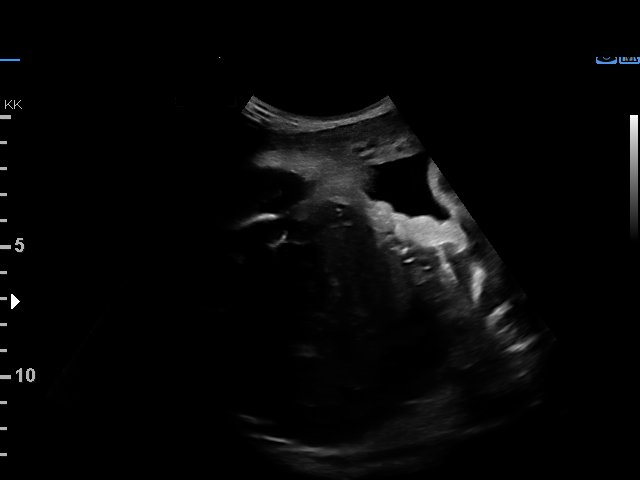
[im 48/52]
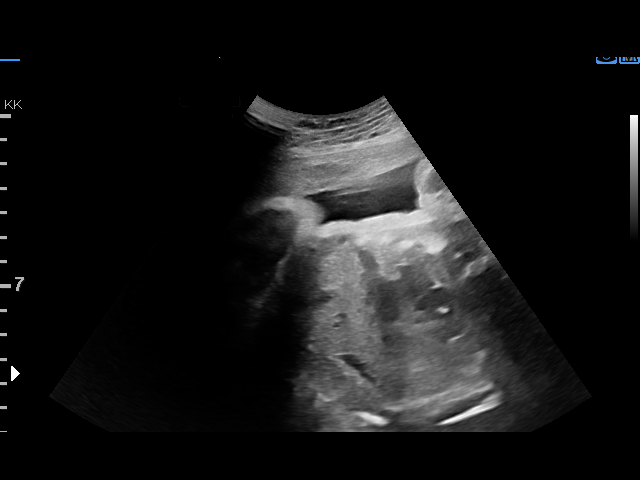
[im 52/52]
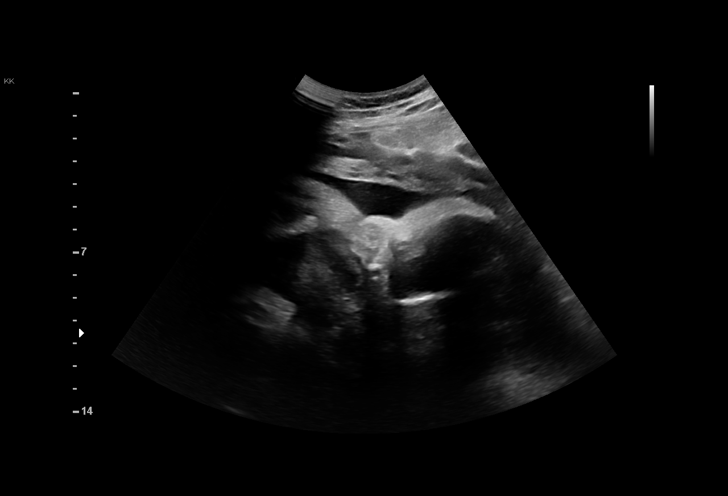

[14 of 28 positions shown; findings below may reference images not displayed]

[REDACTED]. [HOSPITAL],
                   ZOLOTUCH[facility] CNM

    W/NONSTRESS                                       PAMPLIN

Indications

 Hypertension - Gestational
 Uterine fibroids affecting pregnancy in third  O34.13,
 trimester, antepartum
 Advanced maternal age multigravida 35+,
 third trimester
 Genetic carrier (SMA, Fragile X -
 intermediate)
 36 weeks gestation of pregnancy
Vital Signs

                                                Height:        5'3"
Fetal Evaluation

 Num Of Fetuses:         1
 Fetal Heart Rate(bpm):  138
 Cardiac Activity:       Observed
 Presentation:           Cephalic
 Placenta:               Posterior
 P. Cord Insertion:      Previously Visualized

 Amniotic Fluid
 AFI FV:      Within normal limits

 AFI Sum(cm)     %Tile       Largest Pocket(cm)
 13.31           47
 RUQ(cm)       RLQ(cm)       LUQ(cm)        LLQ(cm)

Biophysical Evaluation

 Amniotic F.V:   Pocket => 2 cm             F. Tone:        Observed
 F. Movement:    Observed                   N.S.T:          Reactive
 F. Breathing:   Not Observed               Score:          [DATE]
Biometry

 BPD:        90  mm     G. Age:  36w 3d         62  %    CI:        80.93   %    70 - 86
                                                         FL/HC:      22.6   %    20.1 -
 HC:      315.9  mm     G. Age:  35w 3d          7  %    HC/AC:      0.97        0.93 -
 AC:      327.2  mm     G. Age:  36w 4d         68  %    FL/BPD:     79.4   %    71 - 87
 FL:       71.5  mm     G. Age:  36w 4d         52  %    FL/AC:      21.9   %    20 - 24

 Est. FW:    4437  gm      6 lb 8 oz     55  %
OB History

 Gravidity:    5         Term:   1        Prem:   0        SAB:   3
 TOP:          0       Ectopic:  0        Living: 1
Gestational Age

 LMP:           36w 3d        Date:  08/16/19                 EDD:   05/22/20
 U/S Today:     36w 2d                                        EDD:   05/23/20
 Best:          36w 3d     Det. By:  LMP  (08/16/19)          EDD:   05/22/20
Anatomy

 Cranium:               Previously seen        LVOT:                   Previously seen
 Cavum:                 Previously seen        Aortic Arch:            Previously seen
 Ventricles:            Appears normal         Ductal Arch:            Previously seen
 Choroid Plexus:        Previously seen        Diaphragm:              Appears normal
 Cerebellum:            Previously seen        Stomach:                Appears normal, left
                                                                       sided
 Posterior Fossa:       Previously seen        Abdomen:                Previously seen
 Nuchal Fold:           Previously seen        Abdominal Wall:         Previously seen
 Face:                  Orbits and profile     Cord Vessels:           Previously seen
                        previously seen
 Lips:                  Previously seen        Kidneys:                Appear normal
 Palate:                Previously seen        Bladder:                Appears normal
 Thoracic:              Previously seen        Spine:                  Previously seen
 Heart:                 Appeared normal        Upper Extremities:      Previously seen
                        previously; EIF
 RVOT:                  Previously seen        Lower Extremities:      Previously seen

 Other:  Heels/feet and open hands/5th digits previously visualized.
Cervix Uterus Adnexa

 Cervix
 Not visualized (advanced GA >87wks)
Comments

 This patient was seen for a follow up growth scan and
 biophysical profile due to gestational hypertension that is not
 currently treated with any medications.  She denies any
 problems since her last exam.
 She was informed that the fetal growth and amniotic fluid
 level appears appropriate for her gestational age.
 A biophysical profile performed today was [DATE].  She
 received a -2 for fetal breathing movements that did not meet
 criteria.  She subsequently had a reactive nonstress test
 making her total biophysical profile score [DATE].
 Due to gestational hypertension, she is already scheduled for
 delivery in 4 days.
